# Patient Record
Sex: Male | Born: 1945 | Race: White | Hispanic: No | Marital: Married | Smoking: Current every day smoker
Health system: Southern US, Community
[De-identification: ages and names within clinical notes are randomized; demographics above are authoritative.]

## PROBLEM LIST (undated history)

## (undated) DIAGNOSIS — S2232XA Fracture of one rib, left side, initial encounter for closed fracture: Secondary | ICD-10-CM

## (undated) DIAGNOSIS — M1990 Unspecified osteoarthritis, unspecified site: Secondary | ICD-10-CM

## (undated) DIAGNOSIS — Z87442 Personal history of urinary calculi: Secondary | ICD-10-CM

## (undated) DIAGNOSIS — F329 Major depressive disorder, single episode, unspecified: Secondary | ICD-10-CM

## (undated) DIAGNOSIS — F1090 Alcohol use, unspecified, uncomplicated: Secondary | ICD-10-CM

## (undated) DIAGNOSIS — I712 Thoracic aortic aneurysm, without rupture, unspecified: Secondary | ICD-10-CM

## (undated) DIAGNOSIS — M459 Ankylosing spondylitis of unspecified sites in spine: Secondary | ICD-10-CM

## (undated) DIAGNOSIS — C801 Malignant (primary) neoplasm, unspecified: Secondary | ICD-10-CM

## (undated) DIAGNOSIS — Z7289 Other problems related to lifestyle: Secondary | ICD-10-CM

## (undated) DIAGNOSIS — N400 Enlarged prostate without lower urinary tract symptoms: Secondary | ICD-10-CM

## (undated) DIAGNOSIS — Z789 Other specified health status: Secondary | ICD-10-CM

## (undated) DIAGNOSIS — H269 Unspecified cataract: Secondary | ICD-10-CM

## (undated) DIAGNOSIS — F419 Anxiety disorder, unspecified: Secondary | ICD-10-CM

## (undated) DIAGNOSIS — H9190 Unspecified hearing loss, unspecified ear: Secondary | ICD-10-CM

## (undated) DIAGNOSIS — I10 Essential (primary) hypertension: Secondary | ICD-10-CM

## (undated) DIAGNOSIS — F4310 Post-traumatic stress disorder, unspecified: Secondary | ICD-10-CM

## (undated) DIAGNOSIS — H209 Unspecified iridocyclitis: Secondary | ICD-10-CM

## (undated) DIAGNOSIS — F32A Depression, unspecified: Secondary | ICD-10-CM

## (undated) DIAGNOSIS — J449 Chronic obstructive pulmonary disease, unspecified: Secondary | ICD-10-CM

## (undated) DIAGNOSIS — I2510 Atherosclerotic heart disease of native coronary artery without angina pectoris: Secondary | ICD-10-CM

## (undated) HISTORY — DX: Essential (primary) hypertension: I10

## (undated) HISTORY — PX: BONE CYST EXCISION: SHX376

## (undated) HISTORY — DX: Thoracic aortic aneurysm, without rupture, unspecified: I71.20

## (undated) HISTORY — DX: Alcohol use, unspecified, uncomplicated: F10.90

## (undated) HISTORY — DX: Other specified health status: Z78.9

## (undated) HISTORY — DX: Atherosclerotic heart disease of native coronary artery without angina pectoris: I25.10

## (undated) HISTORY — DX: Thoracic aortic aneurysm, without rupture: I71.2

## (undated) HISTORY — DX: Other problems related to lifestyle: Z72.89

## (undated) SURGERY — CYSTO
Anesthesia: Spinal

---

## 2001-09-04 ENCOUNTER — Ambulatory Visit (HOSPITAL_COMMUNITY): Admission: RE | Admit: 2001-09-04 | Discharge: 2001-09-04 | Payer: Self-pay | Admitting: Unknown Physician Specialty

## 2001-09-04 ENCOUNTER — Encounter: Payer: Self-pay | Admitting: Unknown Physician Specialty

## 2006-05-23 ENCOUNTER — Ambulatory Visit (HOSPITAL_COMMUNITY): Admission: RE | Admit: 2006-05-23 | Discharge: 2006-05-23 | Payer: Self-pay | Admitting: Family Medicine

## 2007-12-08 ENCOUNTER — Emergency Department (HOSPITAL_COMMUNITY): Admission: EM | Admit: 2007-12-08 | Discharge: 2007-12-08 | Payer: Self-pay | Admitting: Emergency Medicine

## 2011-03-28 ENCOUNTER — Ambulatory Visit (HOSPITAL_COMMUNITY)
Admission: RE | Admit: 2011-03-28 | Discharge: 2011-03-28 | Disposition: A | Discharge status: Home / Self Care | Payer: 59 | Source: Ambulatory Visit | Attending: Anesthesiology | Admitting: Anesthesiology

## 2011-03-28 ENCOUNTER — Other Ambulatory Visit (HOSPITAL_COMMUNITY): Payer: Self-pay | Admitting: Anesthesiology

## 2011-03-28 DIAGNOSIS — S2249XA Multiple fractures of ribs, unspecified side, initial encounter for closed fracture: Secondary | ICD-10-CM | POA: Insufficient documentation

## 2011-03-28 DIAGNOSIS — J90 Pleural effusion, not elsewhere classified: Secondary | ICD-10-CM | POA: Insufficient documentation

## 2011-03-28 DIAGNOSIS — X58XXXA Exposure to other specified factors, initial encounter: Secondary | ICD-10-CM | POA: Insufficient documentation

## 2011-03-28 DIAGNOSIS — R079 Chest pain, unspecified: Secondary | ICD-10-CM | POA: Insufficient documentation

## 2011-03-28 DIAGNOSIS — R52 Pain, unspecified: Secondary | ICD-10-CM

## 2011-03-28 DIAGNOSIS — R0602 Shortness of breath: Secondary | ICD-10-CM | POA: Insufficient documentation

## 2011-04-05 ENCOUNTER — Other Ambulatory Visit (HOSPITAL_COMMUNITY): Payer: Self-pay | Admitting: Family Medicine

## 2011-04-05 DIAGNOSIS — S2239XA Fracture of one rib, unspecified side, initial encounter for closed fracture: Secondary | ICD-10-CM

## 2011-04-06 ENCOUNTER — Ambulatory Visit (HOSPITAL_COMMUNITY)
Admission: RE | Admit: 2011-04-06 | Discharge: 2011-04-06 | Disposition: A | Discharge status: Home / Self Care | Payer: 59 | Source: Ambulatory Visit | Attending: Family Medicine | Admitting: Family Medicine

## 2011-04-06 DIAGNOSIS — X58XXXA Exposure to other specified factors, initial encounter: Secondary | ICD-10-CM | POA: Insufficient documentation

## 2011-04-06 DIAGNOSIS — R079 Chest pain, unspecified: Secondary | ICD-10-CM | POA: Insufficient documentation

## 2011-04-06 DIAGNOSIS — S2249XA Multiple fractures of ribs, unspecified side, initial encounter for closed fracture: Secondary | ICD-10-CM | POA: Insufficient documentation

## 2011-04-06 DIAGNOSIS — S2239XA Fracture of one rib, unspecified side, initial encounter for closed fracture: Secondary | ICD-10-CM

## 2011-06-09 ENCOUNTER — Encounter (HOSPITAL_COMMUNITY): Payer: Self-pay | Admitting: Pharmacy Technician

## 2011-06-10 ENCOUNTER — Encounter (HOSPITAL_COMMUNITY)
Admission: RE | Admit: 2011-06-10 | Discharge: 2011-06-10 | Disposition: A | Discharge status: Home / Self Care | Payer: 59 | Source: Ambulatory Visit | Attending: Urology | Admitting: Urology

## 2011-06-10 ENCOUNTER — Other Ambulatory Visit: Payer: Self-pay

## 2011-06-10 ENCOUNTER — Encounter (HOSPITAL_COMMUNITY): Payer: Self-pay

## 2011-06-10 HISTORY — DX: Benign prostatic hyperplasia without lower urinary tract symptoms: N40.0

## 2011-06-10 HISTORY — DX: Major depressive disorder, single episode, unspecified: F32.9

## 2011-06-10 HISTORY — DX: Depression, unspecified: F32.A

## 2011-06-10 HISTORY — DX: Post-traumatic stress disorder, unspecified: F43.10

## 2011-06-10 HISTORY — DX: Ankylosing spondylitis of unspecified sites in spine: M45.9

## 2011-06-10 HISTORY — DX: Unspecified cataract: H26.9

## 2011-06-10 HISTORY — DX: Fracture of one rib, left side, initial encounter for closed fracture: S22.32XA

## 2011-06-10 LAB — BASIC METABOLIC PANEL
BUN: 9 mg/dL (ref 6–23)
Chloride: 98 mEq/L (ref 96–112)
Creatinine, Ser: 0.7 mg/dL (ref 0.50–1.35)
GFR calc Af Amer: >90 mL/min (ref >90)
Glucose, Bld: 121 mg/dL — ABNORMAL HIGH (ref 70–99)
Potassium: 4.1 mEq/L (ref 3.5–5.1)

## 2011-06-10 LAB — SURGICAL PCR SCREEN: MRSA, PCR: NEGATIVE

## 2011-06-10 NOTE — Patient Instructions (Signed)
[name]  06/10/2011   Your procedure is scheduled on:  06/16/11  Report to [facility] at 07:00 AM.  Call this number if you have problems the morning of surgery: [phone]   Remember:   Do not eat food:After Midnight.  May have clear liquids:until Midnight .  Clear liquids include soda, tea, black coffee, apple or grape juice, broth.  Take these medicines the morning of surgery with A SIP OF WATER: Lisinopril-HCTZ, Metoprolol and Flomax. Take your Hydrocodone only if needed.   Do not wear jewelry, make-up or nail polish.  Do not wear lotions, powders, or perfumes. You may wear deodorant.  Do not shave 48 hours prior to surgery.  Do not bring valuables to the hospital.  Contacts, dentures or bridgework may not be worn into surgery.  Leave suitcase in the car. After surgery it may be brought to your room.  For patients admitted to the hospital, checkout time is 11:00 AM the day of discharge.   Patients discharged the day of surgery will not be allowed to drive home.  Name and phone number of your driver:   Special Instructions: CHG Shower Use Special Wash: 1/2 bottle night before surgery and 1/2 bottle morning of surgery.   Please read over the following fact sheets that you were given: Pain Booklet, MRSA Information, Surgical Site Infection Prevention, Anesthesia Post-op Instructions and Care and Recovery After Surgery    Transurethral Resection of the Prostate Transurethral Resection of the Prostate (TURP) is often treatment for non-cancerous (benign) prostatic hyperplasia (BPH) or prostate cancer. BPH commonly begins in middle aged men and can cause symptoms at any age thereafter. The complications that stem from these problems, such as recurrent infection and bladder control and emptying problems, are often helped by this procedure. Both conditions usually cause the prostate to increase in size. TURP is a major surgery that removes part of the prostate gland. The goal is to  remove enough prostate to allow for unobstructed flow of urine. TREATMENT This surgery (TURP procedure) is done using an instrument like a narrow telescope to look into your bladder. This is then used to remove enlarged pieces of your prostate, one piece at a time. This removes the blockage and makes it easier for you to urinate. This is called a transurethral resection of the prostate. A lesser procedure is sometimes done. In this procedure, small cuts are made in the prostate. This lessens the prostates pressure on the urethra. This is called a transurethral incision (cut by the surgeon) of the prostate (TUIP). You will probably be comfortable soon after the operation, but it will take 10 to 12 weeks, or longer, for your prostate to heal after you leave the hospital.  LET YOUR CAREGIVER KNOW ABOUT:  Allergies.   Medications taken including herbs, eye drops, over the counter medications, and creams.   Use of steroids (by mouth or creams).   Previous problems with anesthetics or Novocaine.   History of blood clots (thrombophlebitis).   History of bleeding or blood problems.   Previous surgery.   Previous prostate infections.   Other health problems.  RISKS AND COMPLICATIONS  Rare injury to the bowel (intestine).   Rare injury to the bladder or adjacent blood vessels.   Intestinal/bowel obstruction.   Scarring called "stricture" that cause later problems with the flow of urine.   Bleeding and the need for blood transfusion (more common in those with prostate cancer and those who have previously received radiation therapy).  Inability to control your urine (incontinence). This is more common in those with prostate cancer and those who have received radiation therapy.   Injury to one of the ureters (tubes that drain the kidneys into the bladder) and/or urethra (the tube that drains the bladder).   Injury to the capsule that holds the prostate. This can lead to leakage of fluid  and urine into the belly (abdomen).   The operation can possibly lead to impotence. This is the inability to get an erection. Treatments are available for these types of problems.   Rare over absorption of the fluids used during the operation. This can then cause low blood levels of sodium, brain swelling, strain on the heart, and fluid accumulation in the lungs. This is more common in long procedures.   Infection.   Blood clots in the legs.   As with any major surgery, there is always the rare chance of a complicating stroke, heart attack, or other complications that will be discussed with you by the surgeon and anesthesiologist.  BEFORE THE PROCEDURE   You may be asked to temporarily adjust your diet. If so, your caregiver will give you specific recommendations.   If you are on blood thinners, stop taking them before the operation, or as your caregiver advises.   You should have nothing to eat or drink after midnight prior to your surgery or as suggested by your caregiver. You may have a sip of water to take medications not stopped for the procedure  PROCEDURE  This operation is performed after you have been given a medication to help you sleep (anesthetic), or with a spinal block. The spinal block keeps you awake but numb from the waist down.  No cut or incision is needed. During the operation, your surgeon passes a viewing and cutting instrument (resectoscope) through the penis into the prostate gland. The instrument contains an electric cutting edge. From inside the prostate, this cutter is used to remove part of the prostate.  HOME CARE INSTRUCTIONS  For your own protection, observe the following precautions for 10 days after your operation.  You may go home with a catheter. Take care of it as directed. You will receive instruction on catheter care.   After catheter removal, empty the bladder whenever you feel a definite desire. Do not try to hold the urine for long periods of time.     For 10 days, avoid all lifting, straining, running, strenuous work, walks longer than a couple blocks, riding in a car for extended periods, and sexual relations.   Take 2 tablespoons of heavy mineral oil or Metamucil night and morning for 3 or 4 days. After that, gradually reduce the dose to one or two teaspoons twice daily. Stop it after the stools have been normal for a week. If you become constipated, do not strain to move your bowels. You may use an enema. Notify your caregiver about problems.   Even after complete healing, you may continue to urinate once or twice during the night.   In addition to your usual medications, you may be given an antibiotic to take for 10-14 days. Notify your caregiver if you have any side effects or problems with the medication.   Avoid alcohol and caffeinated drinks for 2 weeks, as they are irritating to the bladder. Decaffeinated drinks are fine.   Eat a regular diet, avoiding spicy foods for 2 weeks.   You may continue non-strenuous activities. It is always important to keep active after an  operation. This lessens the chance of developing blood clots.   You may see some recurrence of blood in the urine after discharge from the hospital. Even a small amount of blood colors the urine very red. If this occurs, force fluids again as you did in the hospital. This is generally not a concern.  SEEK MEDICAL CARE IF:   You have chills or night sweats.   You are leaking around your catheter or have problems with your catheter.   You develop side effects that you think are coming from your medications.  SEEK IMMEDIATE MEDICAL CARE IF:   You are suddenly unable to urinate. This is an emergency.   You develop shortness of breath or chest pains.   Bleeding persists or clots develop.   You have a fever.   You develop pain in your back or over your lower belly (abdomen).   You develop pain or swelling in your legs.   You develop swelling in your abdomen  or have a sudden weight gain.   The problems get worse rather than better.  Document Released: 04/04/2005 Document Revised: 12/15/2010 Document Reviewed: 10/21/2008 [facility] Patient Information 2012 [place], [place].   PATIENT INSTRUCTIONS POST-ANESTHESIA  IMMEDIATELY FOLLOWING SURGERY:  Do not drive or operate machinery for the first twenty four hours after surgery.  Do not make any important decisions for twenty four hours after surgery or while taking narcotic pain medications or sedatives.  If you develop intractable nausea and vomiting or a severe headache please notify your doctor immediately.  FOLLOW-UP:  Please make an appointment with your surgeon as instructed. You do not need to follow up with anesthesia unless specifically instructed to do so.  WOUND CARE INSTRUCTIONS (if applicable):  Keep a dry clean dressing on the anesthesia/puncture wound site if there is drainage.  Once the wound has quit draining you may leave it open to air.  Generally you should leave the bandage intact for twenty four hours unless there is drainage.  If the epidural site drains for more than 36-48 hours please call the anesthesia department.  QUESTIONS?:  Please feel free to call your physician or the hospital operator if you have any questions, and they will be happy to assist you.     [facility] Anesthesia Department [address] [place] [phone]

## 2011-06-16 ENCOUNTER — Encounter (HOSPITAL_COMMUNITY)
Admission: RE | Disposition: A | Discharge status: Home / Self Care | Payer: Self-pay | Source: Ambulatory Visit | Attending: Urology

## 2011-06-16 ENCOUNTER — Ambulatory Visit (HOSPITAL_COMMUNITY): Payer: 59 | Admitting: Anesthesiology

## 2011-06-16 ENCOUNTER — Encounter (HOSPITAL_COMMUNITY): Payer: Self-pay | Admitting: *Deleted

## 2011-06-16 ENCOUNTER — Encounter (HOSPITAL_COMMUNITY): Payer: Self-pay | Admitting: Anesthesiology

## 2011-06-16 ENCOUNTER — Encounter (HOSPITAL_COMMUNITY): Payer: Self-pay

## 2011-06-16 ENCOUNTER — Ambulatory Visit (HOSPITAL_COMMUNITY)
Admission: RE | Admit: 2011-06-16 | Discharge: 2011-06-17 | Disposition: A | Discharge status: Home / Self Care | Payer: 59 | Source: Ambulatory Visit | Attending: Urology | Admitting: Urology

## 2011-06-16 DIAGNOSIS — N400 Enlarged prostate without lower urinary tract symptoms: Secondary | ICD-10-CM

## 2011-06-16 DIAGNOSIS — N320 Bladder-neck obstruction: Secondary | ICD-10-CM | POA: Insufficient documentation

## 2011-06-16 DIAGNOSIS — N401 Enlarged prostate with lower urinary tract symptoms: Secondary | ICD-10-CM | POA: Insufficient documentation

## 2011-06-16 DIAGNOSIS — N138 Other obstructive and reflux uropathy: Secondary | ICD-10-CM | POA: Insufficient documentation

## 2011-06-16 DIAGNOSIS — Z01810 Encounter for preprocedural cardiovascular examination: Secondary | ICD-10-CM | POA: Insufficient documentation

## 2011-06-16 DIAGNOSIS — Z01812 Encounter for preprocedural laboratory examination: Secondary | ICD-10-CM | POA: Insufficient documentation

## 2011-06-16 DIAGNOSIS — F17200 Nicotine dependence, unspecified, uncomplicated: Secondary | ICD-10-CM | POA: Insufficient documentation

## 2011-06-16 HISTORY — PX: TRANSURETHRAL RESECTION OF PROSTATE: SHX73

## 2011-06-16 LAB — GLUCOSE, CAPILLARY: Glucose-Capillary: 81 mg/dL (ref 70–99)

## 2011-06-16 SURGERY — TURP (TRANSURETHRAL RESECTION OF PROSTATE)
Anesthesia: Spinal | Site: Prostate | Wound class: Clean Contaminated

## 2011-06-16 MED ORDER — GLYCINE 1.5 % IR SOLN
Status: DC | PRN
  Administered 2011-06-16: 6000 mL

## 2011-06-16 MED ORDER — FENTANYL CITRATE 0.05 MG/ML IJ SOLN
INTRAMUSCULAR | Status: DC | PRN
  Administered 2011-06-16: 20 ug via INTRATHECAL

## 2011-06-16 MED ORDER — ZOLPIDEM TARTRATE 5 MG PO TABS: 5.0000 mg | ORAL_TABLET | Freq: Every evening | ORAL | Status: DC | PRN

## 2011-06-16 MED ORDER — EPHEDRINE SULFATE 50 MG/ML IJ SOLN
INTRAMUSCULAR | Status: DC | PRN
  Administered 2011-06-16 (×3): 10 mg via INTRAVENOUS

## 2011-06-16 MED ORDER — LIDOCAINE HCL (PF) 1 % IJ SOLN
INTRAMUSCULAR | Status: AC
  Filled 2011-06-16: qty 5

## 2011-06-16 MED ORDER — BELLADONNA ALKALOIDS-OPIUM 16.2-60 MG RE SUPP
1.0000 | Freq: Four times a day (QID) | RECTAL | Status: DC | PRN
  Administered 2011-06-16: 1 via RECTAL
  Filled 2011-06-16: qty 1

## 2011-06-16 MED ORDER — LACTATED RINGERS IV SOLN
INTRAVENOUS | Status: DC
  Administered 2011-06-16: 1000 mL via INTRAVENOUS

## 2011-06-16 MED ORDER — PROPOFOL 10 MG/ML IV EMUL
INTRAVENOUS | Status: DC | PRN
  Administered 2011-06-16: 75 ug/kg/min via INTRAVENOUS

## 2011-06-16 MED ORDER — OMEGA-3-ACID ETHYL ESTERS 1 G PO CAPS
1.0000 g | ORAL_CAPSULE | Freq: Every day | ORAL | Status: DC
  Administered 2011-06-16 – 2011-06-17 (×2): 1 g via ORAL
  Filled 2011-06-16 (×2): qty 1

## 2011-06-16 MED ORDER — BUPIVACAINE IN DEXTROSE 0.75-8.25 % IT SOLN
INTRATHECAL | Status: AC
  Filled 2011-06-16: qty 2

## 2011-06-16 MED ORDER — EPHEDRINE SULFATE 50 MG/ML IJ SOLN
INTRAMUSCULAR | Status: AC
  Filled 2011-06-16: qty 1

## 2011-06-16 MED ORDER — PHENOL 1.4 % MT LIQD: 1.0000 | OROMUCOSAL | Status: DC | PRN

## 2011-06-16 MED ORDER — MIDAZOLAM HCL 2 MG/2ML IJ SOLN
INTRAMUSCULAR | Status: AC
  Filled 2011-06-16: qty 2

## 2011-06-16 MED ORDER — PROPOFOL 10 MG/ML IV EMUL
INTRAVENOUS | Status: AC
  Filled 2011-06-16: qty 20

## 2011-06-16 MED ORDER — ONDANSETRON HCL 4 MG/2ML IJ SOLN: 4.0000 mg | Freq: Once | INTRAMUSCULAR | Status: DC | PRN

## 2011-06-16 MED ORDER — MORPHINE SULFATE 2 MG/ML IJ SOLN
2.0000 mg | INTRAMUSCULAR | Status: DC | PRN
  Administered 2011-06-16 (×2): 2 mg via INTRAVENOUS
  Filled 2011-06-16 (×2): qty 1

## 2011-06-16 MED ORDER — ONDANSETRON HCL 4 MG/2ML IJ SOLN: 4.0000 mg | INTRAMUSCULAR | Status: DC | PRN

## 2011-06-16 MED ORDER — METOPROLOL SUCCINATE ER 50 MG PO TB24
50.0000 mg | ORAL_TABLET | Freq: Every day | ORAL | Status: DC
Start: 2011-06-16 — End: 2011-06-17
  Administered 2011-06-17: 50 mg via ORAL
  Filled 2011-06-16: qty 1

## 2011-06-16 MED ORDER — FLAX SEED OIL 1000 MG PO CAPS: 2000.0000 mg | ORAL_CAPSULE | Freq: Every day | ORAL | Status: DC

## 2011-06-16 MED ORDER — FENTANYL CITRATE 0.05 MG/ML IJ SOLN
INTRAMUSCULAR | Status: AC
  Filled 2011-06-16: qty 4

## 2011-06-16 MED ORDER — LISINOPRIL-HYDROCHLOROTHIAZIDE 20-12.5 MG PO TABS: 2.0000 | ORAL_TABLET | Freq: Every day | ORAL | Status: DC

## 2011-06-16 MED ORDER — CIPROFLOXACIN IN D5W 200 MG/100ML IV SOLN
INTRAVENOUS | Status: AC
  Filled 2011-06-16: qty 100

## 2011-06-16 MED ORDER — PHYTOSTEROL ESTERS 500 MG PO CAPS: 1.0000 | ORAL_CAPSULE | Freq: Every day | ORAL | Status: DC

## 2011-06-16 MED ORDER — SODIUM CHLORIDE 0.9 % IR SOLN
3000.0000 mL | Status: DC
  Administered 2011-06-16 – 2011-06-17 (×8): 3000 mL

## 2011-06-16 MED ORDER — CIPROFLOXACIN IN D5W 400 MG/200ML IV SOLN
INTRAVENOUS | Status: DC | PRN
  Administered 2011-06-16: 200 mg via INTRAVENOUS

## 2011-06-16 MED ORDER — DEXTROSE-NACL 5-0.45 % IV SOLN
INTRAVENOUS | Status: DC
  Administered 2011-06-16: 100 mL/h via INTRAVENOUS
  Administered 2011-06-16: 13:00:00 via INTRAVENOUS

## 2011-06-16 MED ORDER — BISACODYL 10 MG RE SUPP: 10.0000 mg | Freq: Every day | RECTAL | Status: DC | PRN

## 2011-06-16 MED ORDER — LISINOPRIL 10 MG PO TABS
20.0000 mg | ORAL_TABLET | Freq: Every day | ORAL | Status: DC
  Administered 2011-06-17: 20 mg via ORAL
  Filled 2011-06-16: qty 2

## 2011-06-16 MED ORDER — ACETAMINOPHEN 325 MG PO TABS: 650.0000 mg | ORAL_TABLET | ORAL | Status: DC | PRN

## 2011-06-16 MED ORDER — GLYCINE 1.5 % IR SOLN
Status: DC | PRN
  Administered 2011-06-16: 21000 mL

## 2011-06-16 MED ORDER — MIDAZOLAM HCL 2 MG/2ML IJ SOLN
1.0000 mg | INTRAMUSCULAR | Status: DC | PRN
  Administered 2011-06-16: 2 mg via INTRAVENOUS

## 2011-06-16 MED ORDER — ADULT MULTIVITAMIN W/MINERALS CH
1.0000 | ORAL_TABLET | Freq: Every day | ORAL | Status: DC
  Administered 2011-06-16 – 2011-06-17 (×2): 1 via ORAL
  Filled 2011-06-16 (×2): qty 1

## 2011-06-16 MED ORDER — FENTANYL CITRATE 0.05 MG/ML IJ SOLN
INTRAMUSCULAR | Status: DC | PRN
  Administered 2011-06-16 (×2): 25 ug via INTRAVENOUS
  Administered 2011-06-16: 30 ug via INTRAVENOUS

## 2011-06-16 MED ORDER — BACITRACIN-NEOMYCIN-POLYMYXIN 400-5-5000 EX OINT: 1.0000 "application " | TOPICAL_OINTMENT | Freq: Three times a day (TID) | CUTANEOUS | Status: DC | PRN

## 2011-06-16 MED ORDER — FENTANYL CITRATE 0.05 MG/ML IJ SOLN: 25.0000 ug | INTRAMUSCULAR | Status: DC | PRN

## 2011-06-16 MED ORDER — MENTHOL 3 MG MT LOZG: 1.0000 | LOZENGE | OROMUCOSAL | Status: DC | PRN

## 2011-06-16 MED ORDER — HYDROCHLOROTHIAZIDE 12.5 MG PO CAPS
12.5000 mg | ORAL_CAPSULE | Freq: Every day | ORAL | Status: DC
  Administered 2011-06-17: 12.5 mg via ORAL
  Filled 2011-06-16: qty 1

## 2011-06-16 MED ORDER — LACTATED RINGERS IV SOLN
INTRAVENOUS | Status: DC | PRN
  Administered 2011-06-16 (×2): via INTRAVENOUS

## 2011-06-16 MED ORDER — STERILE WATER FOR IRRIGATION IR SOLN
Status: DC | PRN
  Administered 2011-06-16: 1000 mL

## 2011-06-16 SURGICAL SUPPLY — 35 items
BAG DECANTER FOR FLEXI CONT (MISCELLANEOUS) ×2 IMPLANT
BAG DRAIN URO TABLE W/ADPT NS (DRAPE) ×2 IMPLANT
BAG DRN 8 ADPR NS SKTRN CSTL (DRAPE) ×1
BAG DRN URN TUBE DRIP CHMBR (OSTOMY) ×1
BAG URINE DRAIN TURP 4L (OSTOMY) ×2 IMPLANT
CABLE HI FREQUENCY MONOPOLAR (ELECTROSURGICAL) ×2 IMPLANT
CATH FOLEY 3WAY 30CC 22F (CATHETERS) ×2 IMPLANT
CLOTH BEACON ORANGE TIMEOUT ST (SAFETY) ×2 IMPLANT
CONNECTOR 5 IN 1 STRAIGHT STRL (MISCELLANEOUS) ×2 IMPLANT
DRAPE STERI URO 23X35 APER SZ5 (DRAPE) ×2 IMPLANT
ELECT CUT LOOP C-MAX 27FR .012 (CUTTING LOOP) ×2
ELECT REM PT RETURN 9FT ADLT (ELECTROSURGICAL) ×2
ELECTRODE CUT LP CMX 27FR .012 (CUTTING LOOP) IMPLANT
ELECTRODE REM PT RTRN 9FT ADLT (ELECTROSURGICAL) ×1 IMPLANT
FLOOR PAD 36X40 (MISCELLANEOUS)
FORMALIN 10 PREFIL 480ML (MISCELLANEOUS) ×1 IMPLANT
GLOVE BIO SURGEON STRL SZ7 (GLOVE) ×2 IMPLANT
GLOVE ECLIPSE 6.5 STRL STRAW (GLOVE) ×1 IMPLANT
GLOVE EXAM NITRILE MD LF STRL (GLOVE) ×1 IMPLANT
GLOVE INDICATOR 6.5 STRL GRN (GLOVE) ×1 IMPLANT
GLOVE INDICATOR 7.0 STRL GRN (GLOVE) ×1 IMPLANT
GLYCINE 1.5% IRRIG UROMATIC (IV SOLUTION) ×13 IMPLANT
GOWN STRL REIN XL XLG (GOWN DISPOSABLE) ×2 IMPLANT
IV NS IRRIG 3000ML ARTHROMATIC (IV SOLUTION) ×2 IMPLANT
KIT ROOM TURNOVER AP CYSTO (KITS) ×2 IMPLANT
MANIFOLD NEPTUNE II (INSTRUMENTS) ×2 IMPLANT
PACK CYSTO (CUSTOM PROCEDURE TRAY) ×2 IMPLANT
PAD ARMBOARD 7.5X6 YLW CONV (MISCELLANEOUS) ×2 IMPLANT
PAD FLOOR 36X40 (MISCELLANEOUS) ×1 IMPLANT
SET IRRIGATING DISP (SET/KITS/TRAYS/PACK) ×2 IMPLANT
SYR 30ML LL (SYRINGE) ×2 IMPLANT
TOWEL OR 17X26 4PK STRL BLUE (TOWEL DISPOSABLE) ×2 IMPLANT
WATER STERILE IRR 1000ML POUR (IV SOLUTION) ×2 IMPLANT
XPEEDA 550 SIDEFIRING FIBER (MISCELLANEOUS) IMPLANT
YANKAUER SUCT BULB TIP 10FT TU (MISCELLANEOUS) ×2 IMPLANT

## 2011-06-16 NOTE — Anesthesia Preprocedure Evaluation (Signed)
Anesthesia Evaluation  Patient identified by MRN, date of birth, ID band Patient awake    Reviewed: Allergy & Precautions, H&P , NPO status , Patient's Chart, lab work & pertinent test results, reviewed documented beta blocker date and time   History of Anesthesia Complications Negative for: history of anesthetic complications  Airway Mallampati: I      Dental  (+) Teeth Intact and Implants   Pulmonary COPDCurrent Smoker,  + rhonchi        Cardiovascular hypertension, Pt. on medications Regular Normal    Neuro/Psych PSYCHIATRIC DISORDERS (PTSD) Depression    GI/Hepatic   Endo/Other    Renal/GU      Musculoskeletal   Abdominal   Peds  Hematology   Anesthesia Other Findings   Reproductive/Obstetrics                           Anesthesia Physical Anesthesia Plan  ASA: III  Anesthesia Plan: Spinal   Post-op Pain Management:    Induction:   Airway Management Planned: Nasal Cannula  Additional Equipment:   Intra-op Plan:   Post-operative Plan:   Informed Consent: I have reviewed the patients History and Physical, chart, labs and discussed the procedure including the risks, benefits and alternatives for the proposed anesthesia with the patient or authorized representative who has indicated his/her understanding and acceptance.     Plan Discussed with:   Anesthesia Plan Comments:         Anesthesia Quick Evaluation

## 2011-06-16 NOTE — Preoperative (Signed)
Beta Blockers   Reason not to administer Beta Blockers:Not Applicable 

## 2011-06-16 NOTE — Plan of Care (Signed)
Problem: Phase I Progression Outcomes Goal: Continue bladder and/or hand irrigation Outcome: Completed/Met Date Met:  06/16/11 Red to pink without clots

## 2011-06-16 NOTE — Anesthesia Procedure Notes (Addendum)
Spinal  Patient location during procedure: OR Start time: 06/16/2011 8:46 AM Staffing CRNA/Resident: ANDRAZA, Yomira Flitton Preanesthetic Checklist Completed: patient identified, site marked, surgical consent, pre-op evaluation, timeout performed, IV checked, risks and benefits discussed and monitors and equipment checked Spinal Block Patient position: right lateral decubitus Prep: Betadine Patient monitoring: heart rate, cardiac monitor, continuous pulse ox and blood pressure Approach: right paramedian Location: L3-4 Injection technique: single-shot Needle Needle type: Spinocan  Needle gauge: 22 G Needle length: 9 cm Assessment Sensory level: T8 Additional Notes ATTEMPTS:1 TRAY ZO:[id number] TRAY EXPIRATION DATE:02/2012  Date/Time: 06/16/2011 9:31 AM Performed by: Carolyne Littles, Cari Burgo Pre-anesthesia Checklist: Patient identified, Patient being monitored, Emergency Drugs available, Timeout performed and Suction available Oxygen Delivery Method: Non-rebreather mask   Sensorcaine.75% 2cc with epi .1 and Fentanyl 20 mcg injected intrathecally at 0846; patient tolerated well.

## 2011-06-16 NOTE — Progress Notes (Signed)
No change in H&P on reexamination. 

## 2011-06-16 NOTE — Op Note (Signed)
NAMEMAKEL, MCMANN NO.:  [id number]  MEDICAL RECORD NO.:  [id number]  LOCATION:  A321                          FACILITY:  [facility]  PHYSICIAN:  Ky Barban, M.D.DATE OF BIRTH:  01/17/1946  DATE OF PROCEDURE:  06/16/2011 DATE OF DISCHARGE:                              OPERATIVE REPORT   PREOPERATIVE DIAGNOSES:  Benign prostatic hypertrophy with bladder neck obstruction.  POSTOPERATIVE DIAGNOSES:  Benign prostatic hypertrophy with bladder neck obstruction.  PROCEDURE:  TUR prostate.  ANESTHESIA:  Spinal.  PROCEDURE IN DETAIL:  The patient under spinal anesthesia, in lithotomy position, usual prep and drape, a #28 Iglesias resectoscope was introduced into the bladder.  The resectoscope was pulled back in mid prostatic urethra.  Median lobe was resected up to the level of the mid prostatic urethra.  Then the bladder neck was circumferentially dissected down to the circular fibers.  Resectoscope was pulled back at the level of the verumontanum rotated to 11 o'clock position.  Resection of the right lobe was done at that point, and once the groove was created then, I resected the right lobe between 11 and 7 o'clock position.  Similarly, the left lobe was resected between 1 and 5 o'clock position.  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  There is a posterior midline tissue, which was resected at this point carefully not to injure the sphincter of the verumontanum.  Then, the anterior midline tissue which was small was resected at the end.  Prostatic urethra is wide open. Chips were evacuated.  Bleeders were coagulated.  Resectoscope was removed and 22 three-way Foley catheter left in for drainage.  CBI is clear.  The patient left the operating room in satisfactory condition.     Ky Barban, M.D.     MIJ/MEDQ  D:  06/16/2011  T:  06/16/2011  Job:  [id number]

## 2011-06-16 NOTE — Transfer of Care (Signed)
Immediate Anesthesia Transfer of Care Note  Patient: Samuel Long  Procedure(s) Performed: Procedure(s) (LRB): TRANSURETHRAL RESECTION OF THE PROSTATE (TURP) (N/A)  Patient Location: PACU  Anesthesia Type: Spinal  Level of Consciousness: awake, alert  and oriented  Airway & Oxygen Therapy: Patient Spontanous Breathing and Patient connected to nasal cannula oxygen  Post-op Assessment: Report given to PACU RN and Post -op Vital signs reviewed and stable  Post vital signs: Reviewed and stable  Complications: No apparent anesthesia complications

## 2011-06-16 NOTE — Brief Op Note (Signed)
06/16/2011  9:54 AM  PATIENT:  Samuel Long  66 y.o. male  PRE-OPERATIVE DIAGNOSIS:  benign prostatic hypertrophy  POST-OPERATIVE DIAGNOSIS:  benign prostatic hypertrophy  PROCEDURE:  Procedure(s) (LRB): TRANSURETHRAL RESECTION OF THE PROSTATE (TURP) (N/A)  SURGEON:  Surgeon(s) and Role:    * Ky Barban, MD - Primary  PHYSICIAN ASSISTANT:   ASSISTANTS: none   ANESTHESIA:   spinal  EBL:  Total I/O In: 1200 [I.V.:1200] Out: -   BLOOD ADMINISTERED:none  DRAINS: Urinary Catheter (Foley)   LOCAL MEDICATIONS USED:  NONE  SPECIMEN:  Source of Specimen:  prostate chips  DISPOSITION OF SPECIMEN:  PATHOLOGY  COUNTS:  YES  TOURNIQUET:  * No tourniquets in log *  DICTATION: .Other Dictation: Dictation Number dictation [id number]  PLAN OF CARE: Admit for overnight observation  PATIENT DISPOSITION:  PACU - hemodynamically stable.   Delay start of Pharmacological VTE agent (>24hrs) due to surgical blood loss or risk of bleeding:

## 2011-06-16 NOTE — Progress Notes (Signed)
PHARMACIST - PHYSICIAN ORDER COMMUNICATION  CONCERNING: P&T Medication Policy on Herbal Medications  DESCRIPTION:  This patient's orders for:  Flax Seed Oil and Phytosterol Esters  have been noted.  This product(s) is classified as an "herbal" or natural product. Due to a lack of definitive safety studies or FDA approval, nonstandard manufacturing practices, plus the potential risk of unknown drug-drug interactions while on inpatient medications, the Pharmacy and Therapeutics Committee does not permit the use of "herbal" or natural products of this type within [facility].   ACTION TAKEN: The pharmacy department is unable to verify this order at this time and your patient has been informed of this safety policy. Please reevaluate patient's clinical condition at discharge and address if the herbal or natural product(s) should be resumed at that time.   Mady Gemma, [facility] 06/16/2011 12:35 PM

## 2011-06-16 NOTE — Anesthesia Postprocedure Evaluation (Addendum)
  Anesthesia Post-op Note  Patient: Samuel Long  Procedure(s) Performed: Procedure(s) (LRB): TRANSURETHRAL RESECTION OF THE PROSTATE (TURP) (N/A)  Patient Location: PACU  Anesthesia Type: Spinal  Level of Consciousness: awake, alert , oriented and patient cooperative  Airway and Oxygen Therapy: Patient Spontanous Breathing and Patient connected to nasal cannula oxygen  Post-op Pain: none  Post-op Assessment: Post-op Vital signs reviewed, Patient's Cardiovascular Status Stable, Respiratory Function Stable, Patent Airway and No signs of Nausea or vomiting  Post-op Vital Signs: Reviewed and stable  Complications: No apparent anesthesia complications  06/17/11  Patient doing very well and pleased with  SAB.  No apparent anesthesia complications.

## 2011-06-16 NOTE — H&P (Signed)
NAMEMICHELE, Long NO.:  [id number]  MEDICAL RECORD NO.:  [id number]  LOCATION:                                 FACILITY:  PHYSICIAN:  Ky Barban, M.D.DATE OF BIRTH:  1945/07/07  DATE OF ADMISSION: DATE OF DISCHARGE:  LH                             HISTORY & PHYSICAL   CHIEF COMPLAINT:  Symptoms of prostatism and elevated PSA.  HISTORY:  This 66 year old gentleman is being followed by me since 2006, after he was referred to me by Dr. Christain Sacramento because of his elevated PSA. He has been running little bit high PSA ever since and he had a prostate biopsy done few years ago, which was negative.  He still has elevated PSA and I have put him on Flomax because his bladder does not empty. Recently, I have worked him up again and he was found to have large prostate with bladder neck obstruction.  His PSA is 7.3.  His 3 PSA is 21.2%.  So, I have advised him to undergo TUR of prostate.  He has been taking Flomax.  It is not helping him to empty his bladder.  Flow study shows that he has bladder in very slow flow rate and large residual urine of 200 mL.  So, I am going to bring him back as I am bringing him in the hospital as outpatient to undergo TUR of prostate.  Procedure limitation and complications were discussed.  PAST HISTORY:  History of passing a kidney stone several years ago.  Has hypertension for which, he takes medicine.  No diabetes.  Never had any other surgery.  FAMILY HISTORY:  One brother had prostate cancer.  PERSONAL HISTORY:  He smokes 1 pack a day.  Drinks some beer every week.  REVIEW OF SYSTEMS:  Unremarkable.  PHYSICAL EXAMINATION:  VITAL SIGNS:  Blood pressure 130/80, temperature is normal. CENTRAL NERVOUS SYSTEM:  No gross neurological deficit. HEAD, NECK, EYE, ENT:  Negative. CHEST:  Symmetrical. HEART:  Regular sinus rhythm.  No murmur. ABDOMEN:  Soft, flat.  Liver, spleen, are kidneys not palpable.  No  CVA tenderness. GENITOURINARY:  External genitalia is uncircumcised, meatus adequate. Testicles are normal. RECTAL:  Normal sphincter tone.  No rectal mass.  Prostate 30 g, smooth and firm, nontender.  IMPRESSION:  Benign prostatic hypertrophy with bladder neck obstruction.  PLAN:  TUR of prostate under anesthesia as outpatient.     Ky Barban, M.D.     MIJ/MEDQ  D:  06/15/2011  T:  06/15/2011  Job:  [id number]

## 2011-06-17 LAB — CBC
HCT: 41.3 % (ref 39.0–52.0)
MCV: 94.7 fL (ref 78.0–100.0)
Platelets: 296 10*3/uL (ref 150–400)
RBC: 4.36 MIL/uL (ref 4.22–5.81)
WBC: 8.4 10*3/uL (ref 4.0–10.5)

## 2011-06-17 LAB — BASIC METABOLIC PANEL
BUN: 6 mg/dL (ref 6–23)
CO2: 29 mEq/L (ref 19–32)
Chloride: 104 mEq/L (ref 96–112)
Creatinine, Ser: 0.59 mg/dL (ref 0.50–1.35)

## 2011-06-17 MED ORDER — TAMSULOSIN HCL 0.4 MG PO CAPS: 0.4000 mg | ORAL_CAPSULE | Freq: Every day | ORAL | Status: DC

## 2011-06-17 MED ORDER — HYDROCODONE-IBUPROFEN 7.5-200 MG PO TABS: 1.0000 | ORAL_TABLET | Freq: Four times a day (QID) | ORAL | Status: DC | PRN

## 2011-06-17 MED ORDER — FENOFIBRATE 145 MG PO TABS: 145.0000 mg | ORAL_TABLET | Freq: Every day | ORAL | Status: DC

## 2011-06-17 MED ORDER — ASPIRIN EC 325 MG PO TBEC: 325.0000 mg | DELAYED_RELEASE_TABLET | Freq: Every day | ORAL | Status: DC

## 2011-06-17 NOTE — Discharge Instructions (Signed)
Transurethral Resection of the Prostate Care After Refer to this sheet in the next few weeks. These discharge instructions provide you with general information on caring for yourself after you leave the hospital. Your caregiver may also give you specific instructions. Your treatment has been planned according to the most current medical practices available, but unavoidable complications sometimes occur. If you have any problems or questions after discharge, please call your caregiver. HOME CARE INSTRUCTIONS  Medications  You will receive medicine for pain management. As your level of discomfort decreases, adjustments in your pain medicines may be made.   Since it is important that you do deep breathing and coughing exercises and increase your activity, it may be helpful to take pain medicines prior to these activities. Take all medicines as directed.   You may be given a medicine (antibiotic) to kill germs following surgery. Finish all medicines. Let your caregiver know if you have any side effects or problems from the medicine.  Hygiene  You can take a shower after surgery.   You should not take a bath while you still have the urethral catheter.  Activity  You will be encouraged to get out of bed as much as possible and increase your activity level as tolerated.   Spend the first week in and around your home. For 10 days, avoid the following:   Straining.   Running.   Strenuous work.   Walks longer than a few blocks.   Riding for extended periods.   Sexual relations.   Do not lift heavy objects (more than 5 pounds/2.25 kilograms) for at least 1 month. When lifting, use your arms instead of your abdominal muscles.   You will be encouraged to walk as tolerated. Do not exert yourself. Increase your activity level slowly. Remember that it is important to keep moving after an operation of any type. This cuts down on the possibility of developing blood clots.   Your caregiver will  tell you when you can resume driving and light housework. Discuss this at your first office visit after discharge.  Diet  No special diet is ordered after a TURP. However, if you are on a special diet for another medical problem, it should be continued.   Normal fluid intake is usually recommended.   Avoid alcohol and caffeinated drinks for 2 weeks. They irritate the bladder. Decaffeinated drinks are okay.   Avoid spicy foods.  Bladder Function  For the first 10 days, empty the bladder whenever you feel a definite desire. Do not try to hold the urine for long periods of time.   Urinating once or twice a night even after you are healed is not uncommon.   You may see some recurrence of blood in the urine after discharge from the hospital. If this occurs, force fluids again as you did in the hospital and reduce your activity.  Bowel Function  You may experience some constipation after surgery. This can be minimized by increasing fluids and fiber in your diet. Drink enough water and fluids to keep your urine clear or pale yellow.   A stool softener may be prescribed for use at home. Do not strain to move your bowels.   If you are requiring increased pain medicine, it is important that you take stool softeners to prevent constipation. This will help to promote proper healing by reducing the need to strain to move your bowels.  Sexual Activity  Semen movement in the opposite direction and into the bladder (retrograde ejaculation) may   occur. Since the semen passes into the bladder, cloudy urine can occur the first time you urinate after intercourse. Or, you may not have an ejaculation during erection. Ask your caregiver when you can resume sexual activity. Retrograde ejaculation and reduced semen discharge should not reduce one's pleasure of intercourse.  Postoperative Visit  Arrange the date and time of your after surgery visit with your caregiver.  Return to Work  After your recovery is  complete, you will be able to return to work and resume all activities. Your caregiver will inform you when you can return to work.  Foley Catheter Care A soft, flexible tube (Foley catheter) has been placed in your bladder to drain urine and fluid. Follow these instructions: Taking Care of the Catheter  Keep the area where the catheter leaves your body clean.   Attach the catheter to the leg so there is no tension on the catheter.   Keep the drainage bag below the level of the bladder, but keep it OFF the floor.   Do not take long soaking baths. Your caregiver will give instructions about showering.   Wash your hands before touching ANYTHING related to the catheter or bag.   Using mild soap and warm water on a washcloth:   Clean the area closest to the catheter insertion site using a circular motion around the catheter.   Clean the catheter itself by wiping AWAY from the insertion site for several inches down the tube.   NEVER wipe upward as this could sweep bacteria up into the urethra (tube in your body that normally drains the bladder) and cause infection.   Place a small amount of sterile lubricant at the tip of the penis where the catheter is entering.  Taking Care of the Drainage Bags  Two drainage bags will be taken home: a large overnight drainage bag, and a smaller leg bag which fits underneath clothing.   It is okay to wear the overnight bag at any time, but NEVER wear the smaller leg bag at night.   Keep the drainage bag well below the level of your bladder. This prevents backflow of urine into the bladder and allows the urine to drain freely.   Anchor the tubing to your leg to prevent pulling or tension on the catheter. Use tape or a leg strap provided by the hospital.   Empty the drainage bag when it is 1/2 to 3/4 full. Wash your hands before and after touching the bag.   Periodically check the tubing for kinks to make sure there is no pressure on the tubing which  could restrict the flow of urine.  Changing the Drainage Bags  Cleanse both ends of the clean bag with alcohol before changing.   Pinch off the rubber catheter to avoid urine spillage during the disconnection.   Disconnect the dirty bag and connect the clean one.   Empty the dirty bag carefully to avoid a urine spill.   Attach the new bag to the leg with tape or a leg strap.  Cleaning the Drainage Bags  Whenever a drainage bag is disconnected, it must be cleaned quickly so it is ready for the next use.   Wash the bag in warm, soapy water.   Rinse the bag thoroughly with warm water.   Soak the bag for 30 minutes in a solution of white vinegar and water (1 cup vinegar to 1 quart warm water).   Rinse with warm water.  SEEK MEDICAL CARE IF:     You have chills or night sweats.   You are leaking around your catheter or have problems with your catheter. It is not uncommon to have sporadic leakage around your catheter as a result of bladder spasms. If the leakage stops, there is not much need for concern. If you are uncertain, call your caregiver.   You develop side effects that you think are coming from your medicines.  SEEK IMMEDIATE MEDICAL CARE IF:   You are suddenly unable to urinate. Check to see if there are any kinks in the drainage tubing that may cause this. If you cannot find any kinks, call your caregiver immediately. This is an emergency.   You develop shortness of breath or chest pains.   Bleeding persists or clots develop in your urine.   You have a fever.   You develop pain in your back or over your lower belly (abdomen).   You develop pain or swelling in your legs.   Any problems you are having get worse rather than better.  MAKE SURE YOU:   Understand these instructions.   Will watch your condition.   Will get help right away if you are not doing well or get worse.  Document Released: 04/04/2005 Document Revised: 12/15/2010 Document Reviewed:  11/26/2008 [employer] Patient Information 2012 [employer]. 

## 2011-06-17 NOTE — Progress Notes (Signed)
Add labs today bmet normal wbc 8.4 hct 41.3

## 2011-06-17 NOTE — Progress Notes (Signed)
Pt discharged home with wife.   Foley in place with leg bag.  Will follow up with Dr. Jerre Simon on Monday.  Pt and wife verbalize understanding of discharge instructions.

## 2011-06-17 NOTE — Progress Notes (Signed)
Afebrile no complaints cbi is clear. Plan  Dc cbi regular diet oob and discharge home with foley catheter to be removed in office on Monday.

## 2011-06-17 NOTE — Progress Notes (Signed)
CBI discontinued. Urine is clear, pink tinged.  Occasional small clot noted in foley tubing.  Drainage bag changed to a leg bag, and pt and wife instructed on how to use.  Pt states he is ready to go home.  MD plans to discharge today

## 2011-06-17 NOTE — Addendum Note (Signed)
Addendum  created 06/17/11 1041 by Glynn Octave, CRNA   Modules edited:Notes Section

## 2011-06-17 NOTE — Progress Notes (Signed)
Patient has done great throughout the night.  He has slept well and urine appears normal.  CBI has been titrated down as the urine got lighter during the night.  He has complained of no well and did not need the Ambien last evening.

## 2011-06-19 ENCOUNTER — Encounter (HOSPITAL_COMMUNITY): Payer: Self-pay | Admitting: Anesthesiology

## 2011-06-19 ENCOUNTER — Encounter (HOSPITAL_COMMUNITY): Payer: Self-pay | Admitting: *Deleted

## 2011-06-19 ENCOUNTER — Emergency Department (HOSPITAL_COMMUNITY)
Admission: EM | Admit: 2011-06-19 | Discharge: 2011-06-19 | Disposition: A | Discharge status: Home / Self Care | Payer: 59 | Attending: Emergency Medicine | Admitting: Emergency Medicine

## 2011-06-19 ENCOUNTER — Emergency Department (HOSPITAL_COMMUNITY): Payer: 59 | Admitting: Anesthesiology

## 2011-06-19 ENCOUNTER — Inpatient Hospital Stay (HOSPITAL_COMMUNITY)
Admission: EM | Admit: 2011-06-19 | Discharge: 2011-06-21 | DRG: 921 | Disposition: A | Discharge status: Home / Self Care | Payer: 59 | Attending: Urology | Admitting: Urology

## 2011-06-19 ENCOUNTER — Encounter (HOSPITAL_COMMUNITY)
Admission: EM | Disposition: A | Discharge status: Home / Self Care | Payer: Self-pay | Source: Home / Self Care | Attending: Urology

## 2011-06-19 DIAGNOSIS — R319 Hematuria, unspecified: Secondary | ICD-10-CM | POA: Insufficient documentation

## 2011-06-19 DIAGNOSIS — J449 Chronic obstructive pulmonary disease, unspecified: Secondary | ICD-10-CM | POA: Diagnosis present

## 2011-06-19 DIAGNOSIS — I10 Essential (primary) hypertension: Secondary | ICD-10-CM | POA: Diagnosis present

## 2011-06-19 DIAGNOSIS — F3289 Other specified depressive episodes: Secondary | ICD-10-CM | POA: Diagnosis present

## 2011-06-19 DIAGNOSIS — Z79899 Other long term (current) drug therapy: Secondary | ICD-10-CM | POA: Insufficient documentation

## 2011-06-19 DIAGNOSIS — Z7982 Long term (current) use of aspirin: Secondary | ICD-10-CM | POA: Insufficient documentation

## 2011-06-19 DIAGNOSIS — Z9889 Other specified postprocedural states: Secondary | ICD-10-CM | POA: Insufficient documentation

## 2011-06-19 DIAGNOSIS — R339 Retention of urine, unspecified: Secondary | ICD-10-CM | POA: Insufficient documentation

## 2011-06-19 DIAGNOSIS — F17200 Nicotine dependence, unspecified, uncomplicated: Secondary | ICD-10-CM | POA: Diagnosis present

## 2011-06-19 DIAGNOSIS — Z87438 Personal history of other diseases of male genital organs: Secondary | ICD-10-CM

## 2011-06-19 DIAGNOSIS — N400 Enlarged prostate without lower urinary tract symptoms: Secondary | ICD-10-CM | POA: Diagnosis present

## 2011-06-19 DIAGNOSIS — F329 Major depressive disorder, single episode, unspecified: Secondary | ICD-10-CM | POA: Insufficient documentation

## 2011-06-19 DIAGNOSIS — F4310 Post-traumatic stress disorder, unspecified: Secondary | ICD-10-CM | POA: Diagnosis present

## 2011-06-19 DIAGNOSIS — J4489 Other specified chronic obstructive pulmonary disease: Secondary | ICD-10-CM | POA: Diagnosis present

## 2011-06-19 DIAGNOSIS — IMO0002 Reserved for concepts with insufficient information to code with codable children: Principal | ICD-10-CM | POA: Diagnosis present

## 2011-06-19 DIAGNOSIS — Y836 Removal of other organ (partial) (total) as the cause of abnormal reaction of the patient, or of later complication, without mention of misadventure at the time of the procedure: Secondary | ICD-10-CM | POA: Diagnosis present

## 2011-06-19 DIAGNOSIS — M459 Ankylosing spondylitis of unspecified sites in spine: Secondary | ICD-10-CM | POA: Diagnosis present

## 2011-06-19 LAB — BASIC METABOLIC PANEL
CO2: 27 mEq/L (ref 19–32)
Glucose, Bld: 114 mg/dL — ABNORMAL HIGH (ref 70–99)
Potassium: 3.6 mEq/L (ref 3.5–5.1)
Sodium: 138 mEq/L (ref 135–145)

## 2011-06-19 LAB — CBC
Hemoglobin: 16.5 g/dL (ref 13.0–17.0)
RBC: 4.97 MIL/uL (ref 4.22–5.81)
WBC: 13.1 10*3/uL — ABNORMAL HIGH (ref 4.0–10.5)

## 2011-06-19 SURGERY — CYSTO
Anesthesia: Spinal | Wound class: Clean Contaminated

## 2011-06-19 MED ORDER — PHENYLEPHRINE HCL 10 MG/ML IJ SOLN
INTRAMUSCULAR | Status: DC | PRN
  Administered 2011-06-19: 100 ug via INTRAVENOUS
  Administered 2011-06-19: 50 ug via INTRAVENOUS
  Administered 2011-06-19 (×2): 100 ug via INTRAVENOUS

## 2011-06-19 MED ORDER — BUPIVACAINE HCL 0.75 % IJ SOLN
INTRAMUSCULAR | Status: DC | PRN
  Administered 2011-06-19: 12.5 mg via INTRATHECAL

## 2011-06-19 MED ORDER — ONDANSETRON 8 MG PO TBDP
ORAL_TABLET | ORAL | Status: AC
  Administered 2011-06-19: 8 mg via ORAL
  Filled 2011-06-19: qty 1

## 2011-06-19 MED ORDER — GLYCINE 1.5 % IR SOLN
Status: DC | PRN
  Administered 2011-06-19: 21000 mL

## 2011-06-19 MED ORDER — FENTANYL CITRATE 0.05 MG/ML IJ SOLN
INTRAMUSCULAR | Status: DC | PRN
  Administered 2011-06-19: 50 ug via INTRAVENOUS
  Administered 2011-06-19: 25 ug via INTRATHECAL

## 2011-06-19 MED ORDER — HYDROMORPHONE HCL PF 2 MG/ML IJ SOLN
INTRAMUSCULAR | Status: AC
  Administered 2011-06-19: 2 mg via INTRAMUSCULAR
  Filled 2011-06-19: qty 1

## 2011-06-19 MED ORDER — MORPHINE SULFATE 2 MG/ML IJ SOLN
2.0000 mg | INTRAMUSCULAR | Status: DC | PRN
  Administered 2011-06-19 – 2011-06-21 (×8): 2 mg via INTRAVENOUS
  Filled 2011-06-19 (×8): qty 1

## 2011-06-19 MED ORDER — ONDANSETRON 8 MG PO TBDP
8.0000 mg | ORAL_TABLET | Freq: Once | ORAL | Status: AC
  Administered 2011-06-19: 8 mg via ORAL

## 2011-06-19 MED ORDER — HYDROMORPHONE HCL PF 2 MG/ML IJ SOLN
2.0000 mg | Freq: Once | INTRAMUSCULAR | Status: AC
  Administered 2011-06-19: 2 mg via INTRAMUSCULAR

## 2011-06-19 MED ORDER — LACTATED RINGERS IV SOLN
INTRAVENOUS | Status: DC | PRN
  Administered 2011-06-19 (×2): via INTRAVENOUS

## 2011-06-19 MED ORDER — HYDROMORPHONE HCL PF 1 MG/ML IJ SOLN
INTRAMUSCULAR | Status: AC
  Administered 2011-06-19: 1 mg via INTRAMUSCULAR
  Filled 2011-06-19: qty 1

## 2011-06-19 MED ORDER — MORPHINE SULFATE 2 MG/ML IJ SOLN
2.0000 mg | Freq: Once | INTRAMUSCULAR | Status: AC
  Administered 2011-06-19: 2 mg via INTRAVENOUS

## 2011-06-19 MED ORDER — HYDROMORPHONE HCL PF 1 MG/ML IJ SOLN
1.0000 mg | Freq: Once | INTRAMUSCULAR | Status: AC
  Administered 2011-06-19: 1 mg via INTRAMUSCULAR

## 2011-06-19 MED ORDER — CIPROFLOXACIN IN D5W 200 MG/100ML IV SOLN
INTRAVENOUS | Status: AC
  Filled 2011-06-19: qty 100

## 2011-06-19 MED ORDER — LISINOPRIL-HYDROCHLOROTHIAZIDE 20-12.5 MG PO TABS: 2.0000 | ORAL_TABLET | Freq: Every day | ORAL | Status: DC

## 2011-06-19 MED ORDER — CIPROFLOXACIN IN D5W 200 MG/100ML IV SOLN
200.0000 mg | INTRAVENOUS | Status: DC
  Administered 2011-06-20: 200 mg via INTRAVENOUS
  Filled 2011-06-19 (×3): qty 100

## 2011-06-19 MED ORDER — DEXTROSE-NACL 5-0.45 % IV SOLN
INTRAVENOUS | Status: DC
  Administered 2011-06-19: 950 mL via INTRAVENOUS
  Administered 2011-06-19 – 2011-06-20 (×2): 1000 mL via INTRAVENOUS

## 2011-06-19 MED ORDER — STERILE WATER FOR IRRIGATION IR SOLN
Status: DC | PRN
  Administered 2011-06-19: 1000 mL

## 2011-06-19 MED ORDER — EPHEDRINE SULFATE 50 MG/ML IJ SOLN
INTRAMUSCULAR | Status: DC | PRN
  Administered 2011-06-19: 5 mg via INTRAVENOUS
  Administered 2011-06-19: 10 mg via INTRAVENOUS

## 2011-06-19 MED ORDER — METOPROLOL TARTRATE 1 MG/ML IV SOLN
INTRAVENOUS | Status: DC | PRN
  Administered 2011-06-19 (×3): 1 mg via INTRAVENOUS

## 2011-06-19 MED ORDER — HYDROCHLOROTHIAZIDE 25 MG PO TABS
25.0000 mg | ORAL_TABLET | Freq: Every day | ORAL | Status: DC
  Administered 2011-06-19 – 2011-06-21 (×3): 25 mg via ORAL
  Filled 2011-06-19 (×3): qty 1

## 2011-06-19 MED ORDER — CIPROFLOXACIN IN D5W 200 MG/100ML IV SOLN
200.0000 mg | Freq: Once | INTRAVENOUS | Status: AC
  Administered 2011-06-19: 200 mg via INTRAVENOUS

## 2011-06-19 MED ORDER — MIDAZOLAM HCL 5 MG/5ML IJ SOLN
INTRAMUSCULAR | Status: DC | PRN
  Administered 2011-06-19: 2 mg via INTRAVENOUS

## 2011-06-19 MED ORDER — PROPOFOL 10 MG/ML IV EMUL
INTRAVENOUS | Status: DC | PRN
  Administered 2011-06-19: 25 ug/kg/min via INTRAVENOUS

## 2011-06-19 MED ORDER — LISINOPRIL 10 MG PO TABS
40.0000 mg | ORAL_TABLET | Freq: Every day | ORAL | Status: DC
  Administered 2011-06-19 – 2011-06-21 (×3): 40 mg via ORAL
  Filled 2011-06-19 (×3): qty 4

## 2011-06-19 MED ORDER — GLYCINE 1.5 % IR SOLN
Status: DC | PRN
  Administered 2011-06-19: 9000 mL

## 2011-06-19 MED ORDER — MORPHINE SULFATE 2 MG/ML IJ SOLN
INTRAMUSCULAR | Status: AC
  Filled 2011-06-19: qty 1

## 2011-06-19 SURGICAL SUPPLY — 40 items
BAG DECANTER FOR FLEXI CONT (MISCELLANEOUS) ×2 IMPLANT
BAG DRAIN URO TABLE W/ADPT NS (DRAPE) ×2 IMPLANT
BAG DRN 8 ADPR NS SKTRN CSTL (DRAPE) ×1
BAG DRN URN TUBE DRIP CHMBR (OSTOMY) ×1
BAG URINE DRAIN TURP 4L (OSTOMY) ×2 IMPLANT
CABLE HI FREQUENCY MONOPOLAR (ELECTROSURGICAL) ×2 IMPLANT
CATH 3WAY 30CC 24FR (CATHETERS) ×1 IMPLANT
CATH FOLEY 3WAY 30CC 22F (CATHETERS) ×1 IMPLANT
CLOTH BEACON ORANGE TIMEOUT ST (SAFETY) ×2 IMPLANT
CONNECTOR 5 IN 1 STRAIGHT STRL (MISCELLANEOUS) ×1 IMPLANT
DRAPE STERI URO 23X35 APER SZ5 (DRAPE) ×2 IMPLANT
ELECT CUT LOOP C-MAX 27FR .012 (CUTTING LOOP) ×2
ELECT REM PT RETURN 9FT ADLT (ELECTROSURGICAL) ×2
ELECTRODE BALL 24/28FR 5MM (UROLOGICAL SUPPLIES) ×1 IMPLANT
ELECTRODE CUT LP CMX 27FR .012 (CUTTING LOOP) IMPLANT
ELECTRODE REM PT RTRN 9FT ADLT (ELECTROSURGICAL) ×1 IMPLANT
FLOOR PAD 36X40 (MISCELLANEOUS)
FORMALIN 10 PREFIL 480ML (MISCELLANEOUS) ×1 IMPLANT
GLOVE BIO SURGEON STRL SZ7 (GLOVE) ×2 IMPLANT
GLOVE ECLIPSE 6.5 STRL STRAW (GLOVE) ×1 IMPLANT
GLOVE EXAM NITRILE MD LF STRL (GLOVE) ×1 IMPLANT
GLOVE INDICATOR 6.5 STRL GRN (GLOVE) ×1 IMPLANT
GLOVE INDICATOR 7.0 STRL GRN (GLOVE) ×1 IMPLANT
GLYCINE 1.5% IRRIG UROMATIC (IV SOLUTION) ×14 IMPLANT
GOWN STRL REIN XL XLG (GOWN DISPOSABLE) ×3 IMPLANT
IV NS IRRIG 3000ML ARTHROMATIC (IV SOLUTION) ×2 IMPLANT
KIT ROOM TURNOVER AP CYSTO (KITS) ×2 IMPLANT
MANIFOLD NEPTUNE II (INSTRUMENTS) ×2 IMPLANT
PACK CYSTO (CUSTOM PROCEDURE TRAY) ×2 IMPLANT
PAD ARMBOARD 7.5X6 YLW CONV (MISCELLANEOUS) ×2 IMPLANT
PAD FLOOR 36X40 (MISCELLANEOUS) ×1 IMPLANT
SET IRRIGATING DISP (SET/KITS/TRAYS/PACK) ×2 IMPLANT
SPONGE GAUZE 4X4 12PLY (GAUZE/BANDAGES/DRESSINGS) ×1 IMPLANT
SYR 30ML LL (SYRINGE) ×2 IMPLANT
SYRINGE IRR TOOMEY STRL 70CC (SYRINGE) ×1 IMPLANT
TAPE CLOTH SILK CARING 3INX10 (GAUZE/BANDAGES/DRESSINGS) ×1 IMPLANT
TOWEL OR 17X26 4PK STRL BLUE (TOWEL DISPOSABLE) ×2 IMPLANT
WATER STERILE IRR 1000ML POUR (IV SOLUTION) ×2 IMPLANT
XPEEDA 550 SIDEFIRING FIBER (MISCELLANEOUS) IMPLANT
YANKAUER SUCT BULB TIP 10FT TU (MISCELLANEOUS) ×2 IMPLANT

## 2011-06-19 NOTE — Anesthesia Postprocedure Evaluation (Addendum)
  Anesthesia Post-op Note  Patient: Samuel Long  Procedure(s) Performed: Procedure(s) (LRB): TRANSURETHRAL RESECTION OF THE PROSTATE (TURP) (N/A)  Patient Location: PACU  Anesthesia Type: Spinal  Level of Consciousness: awake, alert  and oriented  Airway and Oxygen Therapy: Patient Spontanous Breathing and Patient connected to face mask oxygen  Post-op Pain: none  Post-op Assessment: Post-op Vital signs reviewed, Patient's Cardiovascular Status Stable, Respiratory Function Stable, Patent Airway and No signs of Nausea or vomiting  Post-op Vital Signs: Reviewed and stable  Complications: No apparent anesthesia complications Spinal at T10 06/20/11  Patient doing well.  VSS.  Denies headache and backpain.  No apparent anesthesia complications.

## 2011-06-19 NOTE — ED Notes (Signed)
Unable to irrigate foley upon pts arrival. Pt c/o extreme pain when attempting to irrigate also.  PA to bedside.Urology cart to bedside. Foley removed with clots following. 86F attempted and 65F cudae by Hyacinth Meeker, RA unable to pass. Dr. Jerre Simon notified. Dr. Jerre Simon to come in and reinsert foley.

## 2011-06-19 NOTE — ED Provider Notes (Addendum)
History     CSN: [id number]  Arrival date & time 06/19/11  0434   First MD Initiated Contact with Patient 06/19/11 [phone]      Chief Complaint  Patient presents with  . Hematuria    (Consider location/radiation/quality/duration/timing/severity/associated sxs/prior treatment) Patient is a 66 y.o. male presenting with hematuria.  Hematuria This is a recurrent problem. The current episode started today. The problem is unchanged. He describes the hematuria as gross hematuria. His pain is at a severity of 0/10. He is experiencing no pain. He describes his urine color as dark red. Pertinent negatives include no abdominal pain, chills, fever, nausea or vomiting. His past medical history is significant for BPH.   Patient status post TURP by urology on Thursday bleeding it cleared earlier in the day and then this morning woke up with bleeding around the Foley catheter and lots of red blood in the urine again. Bladder was filling fall but recently more fluid went into the leg bag a Foley catheter and now bladder doesn't feel full.  Past Medical History  Diagnosis Date  . Spondylitis, ankylosing   . Hypertension   . BPH (benign prostatic hyperplasia)   . Depression   . PTSD (post-traumatic stress disorder)   . Cataracts, bilateral   . Left rib fracture     from recent fall    Past Surgical History  Procedure Date  . Bone cyst excision     left foot, [name]  . Transurethral resection of prostate     Family History  Problem Relation Age of Onset  . Anesthesia problems Neg Hx   . Hypotension Neg Hx   . Malignant hyperthermia Neg Hx   . Pseudochol deficiency Neg Hx     History  Substance Use Topics  . Smoking status: Current Everyday Smoker -- 0.5 packs/day for 40 years    Types: Cigarettes  . Smokeless tobacco: Never Used  . Alcohol Use: Yes     2 fifths of bourbon/wk      Review of Systems  Constitutional: Negative for fever and chills.  HENT: Negative for congestion.     Respiratory: Negative for cough and shortness of breath.   Cardiovascular: Negative for chest pain.  Gastrointestinal: Negative for nausea, vomiting and abdominal pain.  Genitourinary: Positive for hematuria and decreased urine volume. Negative for scrotal swelling.  Musculoskeletal: Negative for back pain.  Skin: Negative for rash.  Neurological: Negative for headaches.  Hematological: Does not bruise/bleed easily.    Allergies  Review of patient's allergies indicates no known allergies.  Home Medications   Current Outpatient Rx  Name Route Sig Dispense Refill  . ASPIRIN EC 325 MG PO TBEC Oral Take 1 tablet (325 mg total) by mouth daily. 30 tablet   . FENOFIBRATE 145 MG PO TABS Oral Take 1 tablet (145 mg total) by mouth daily. 30 tablet 0  . OMEGA-3 FATTY ACIDS 1000 MG PO CAPS Oral Take 2 g by mouth daily.    Marland Kitchen FLAX SEED OIL 1000 MG PO CAPS Oral Take 2,000 mg by mouth daily.    Marland Kitchen HYDROCODONE-IBUPROFEN 7.5-200 MG PO TABS Oral Take 1 tablet by mouth every 6 (six) hours as needed. For arthritis pain 30 tablet   . LISINOPRIL-HYDROCHLOROTHIAZIDE 20-12.5 MG PO TABS Oral Take 2 tablets by mouth daily.    Marland Kitchen METOPROLOL SUCCINATE ER 50 MG PO TB24 Oral Take 50 mg by mouth daily. Take with or immediately following a meal.    . ADULT MULTIVITAMIN W/MINERALS CH Oral  Take 1 tablet by mouth daily.    Marland Kitchen TAMSULOSIN HCL 0.4 MG PO CAPS Oral Take 1 capsule (0.4 mg total) by mouth daily. 30 capsule     BP 165/94  Pulse 78  Temp(Src) 97.8 F (36.6 C) (Oral)  Resp 20  Ht 5\' 9"  (1.753 m)  Wt 180 lb (81.647 kg)  BMI 26.58 kg/m2  SpO2 99%  Physical Exam  Nursing note and vitals reviewed. Constitutional: He is oriented to person, place, and time. He appears well-developed and well-nourished. No distress.  HENT:  Head: Normocephalic and atraumatic.  Mouth/Throat: Oropharynx is clear and moist.  Eyes: Conjunctivae and EOM are normal. Pupils are equal, round, and reactive to light.  Neck: Normal  range of motion. Neck supple.  Cardiovascular: Normal rate, regular rhythm and normal heart sounds.   No murmur heard. Pulmonary/Chest: Effort normal and breath sounds normal.  Abdominal: Soft. Bowel sounds are normal. There is no tenderness.  Genitourinary: Penis normal.       Irrigating Foley in place with leg bag gross hematuria some clots in the bag. No scrotal swelling.  Musculoskeletal: Normal range of motion.  Neurological: He is alert and oriented to person, place, and time. No cranial nerve deficit. He exhibits normal muscle tone. Coordination normal.  Skin: Skin is warm. No rash noted.    ED Course  Procedures (including critical care time)  Labs Reviewed - No data to display No results found.   1. Hematuria       MDM   Patient status post TURP on Thursday by Dr. Frann Rider, woke up this morning with bleeding around the Foley catheter and bright red blood noted in the bag the bleeding had cleared earlier in the day for the first time. TURP was done for benign prostatic hyperplasia.   Here in the emergency department there was bloody urine in this leg bag we removed this and irrigated and placed a larger bag were able to irrigate some clots fluid was returning we'll observe her for a period time to make sure it still drains the drains well may be discharged home patient will return if the clots off again and will followup with urology on Monday. Patient is not on Coumadin or any blood thinners.  Suspect they'll Foley catheter may have gotten pulled on during the night or perhaps rebleeding occurred and clot formed inside the bladder.   Addendum: Current time is 6:18 AM. [place] now with good flow of urine into the larger Foley bag still grossly bloody bladder emptying well as per patient. Wife given instructions on how to irrigate. She wants to go home with a larger bags we does not empty is often. Patient already has scheduled followup with urology at 10 AM on  Monday.       Shelda Jakes, MD 06/19/11 1610  Shelda Jakes, MD 06/19/11 [phone]

## 2011-06-19 NOTE — Anesthesia Preprocedure Evaluation (Addendum)
Anesthesia Evaluation  Patient identified by MRN, date of birth, ID band Patient awake  General Assessment Comment:Patient on lopressor q 24 hours, no dose today.  Will give small increments of lopressor IV preop and as needed.  Reviewed: Allergy & Precautions, H&P , NPO status , Patient's Chart, lab work & pertinent test results, reviewed documented beta blocker date and time   History of Anesthesia Complications Negative for: history of anesthetic complications  Airway Mallampati: I  Neck ROM: Limited    Dental  (+) Teeth Intact   Pulmonary COPDCurrent Smoker,          Cardiovascular hypertension, Pt. on medications Rhythm:Regular Rate:Normal     Neuro/Psych PTSD   GI/Hepatic negative GI ROS, Neg liver ROS,   Endo/Other  negative endocrine ROS  Renal/GU TURP 2/28    Foley catheter with irrigation placed in ED    Musculoskeletal   Abdominal Normal abdominal exam  (+)   Peds  Hematology   Anesthesia Other Findings   Reproductive/Obstetrics                         Anesthesia Physical Anesthesia Plan  ASA: III  Anesthesia Plan: Spinal   Post-op Pain Management:    Induction:   Airway Management Planned: Simple Face Mask  Additional Equipment:   Intra-op Plan:   Post-operative Plan:   Informed Consent: I have reviewed the patients History and Physical, chart, labs and discussed the procedure including the risks, benefits and alternatives for the proposed anesthesia with the patient or authorized representative who has indicated his/her understanding and acceptance.     Plan Discussed with: Anesthesiologist  Anesthesia Plan Comments: (Telephone consult with Dr. Jayme Cloud.  Patient is agreeable to another spinal.)        Anesthesia Quick Evaluation

## 2011-06-19 NOTE — ED Notes (Signed)
Pt to OR.

## 2011-06-19 NOTE — ED Notes (Addendum)
Irrigated with 100 ml NS.  100 ml return.  Several clots returned as well.  Pt still having some drainage around catheter as well. Bedside drainage bag connected and draining to gravity. Instruction given to pt and wife on how to irrigate.  Wife verbalized understanding and returned demonstration.

## 2011-06-19 NOTE — ED Notes (Signed)
Pt had TURP Thursday with Dr. Frann Rider, woke up this am with bleeding around foley cath, bright red blood noted upon exam

## 2011-06-19 NOTE — Anesthesia Procedure Notes (Signed)
Spinal  Patient location during procedure: OR Start time: 06/19/2011 12:45 PM Staffing CRNA/Resident: Glynn Octave Preanesthetic Checklist Completed: patient identified, site marked, surgical consent, pre-op evaluation, timeout performed, IV checked, risks and benefits discussed and monitors and equipment checked Spinal Block Patient position: right lateral decubitus Prep: Betadine Patient monitoring: heart rate, cardiac monitor, continuous pulse ox and blood pressure Approach: right paramedian Location: L3-4 Injection technique: single-shot Needle Needle type: Spinocan  Needle gauge: 22 G Needle length: 9 cm Assessment Sensory level: T8 Additional Notes ATTEMPTS:1 TRAY [id number] TRAY EXPIRATION DATE:02/2012

## 2011-06-19 NOTE — Consult Note (Signed)
Report#[id number]

## 2011-06-19 NOTE — Discharge Instructions (Signed)
If the Foley and urine stops draining will need to return. Otherwise followup with urology at least by phone on Monday. Return for any new or worse symptoms.

## 2011-06-19 NOTE — ED Provider Notes (Signed)
History     CSN: [id number]  Arrival date & time 06/19/11  7829   First MD Initiated Contact with Patient 06/19/11 0845      Chief Complaint  Patient presents with  . Hematuria    (Consider location/radiation/quality/duration/timing/severity/associated sxs/prior treatment) HPI Comments: Pt had TURP 3 days ago by dr. Jerre Simon.  He has had indwelling catheter since then.  Had blood clots evident last PM with foley blockage.  Came to ED and had irrigation and clearing of urine. Discharged home.  He has returned this AM with complete catheter blockage and bleeding evident in bag and around foley.  C/o bladder fullness.  Patient is a 66 y.o. male presenting with hematuria. The history is provided by the patient and the spouse. No language interpreter was used.  Hematuria This is a new problem. The current episode started today. The problem has been gradually worsening since onset. He describes the hematuria as gross hematuria. The pain is severe. He describes his urine color as dark red. Irritative symptoms include urgency. Irritative symptoms do not include frequency. Associated symptoms include inability to urinate. Pertinent negatives include no dysuria or flank pain. His sexual activity is non-contributory to the current illness. His past medical history is significant for BPH.    Past Medical History  Diagnosis Date  . Spondylitis, ankylosing   . Hypertension   . BPH (benign prostatic hyperplasia)   . Depression   . PTSD (post-traumatic stress disorder)   . Cataracts, bilateral   . Left rib fracture     from recent fall    Past Surgical History  Procedure Date  . Bone cyst excision     left foot, [name]  . Transurethral resection of prostate     Family History  Problem Relation Age of Onset  . Anesthesia problems Neg Hx   . Hypotension Neg Hx   . Malignant hyperthermia Neg Hx   . Pseudochol deficiency Neg Hx     History  Substance Use Topics  . Smoking status: Current  Everyday Smoker -- 0.5 packs/day for 40 years    Types: Cigarettes  . Smokeless tobacco: Never Used  . Alcohol Use: Yes     2 fifths of bourbon/wk      Review of Systems  Genitourinary: Positive for urgency, hematuria and difficulty urinating. Negative for dysuria, frequency and flank pain.  All other systems reviewed and are negative.    Allergies  Review of patient's allergies indicates no known allergies.  Home Medications   Current Outpatient Rx  Name Route Sig Dispense Refill  . ASPIRIN EC 325 MG PO TBEC Oral Take 1 tablet (325 mg total) by mouth daily. 30 tablet   . FENOFIBRATE 145 MG PO TABS Oral Take 1 tablet (145 mg total) by mouth daily. 30 tablet 0  . OMEGA-3 FATTY ACIDS 1000 MG PO CAPS Oral Take 2 g by mouth daily.    Marland Kitchen FLAX SEED OIL 1000 MG PO CAPS Oral Take 2,000 mg by mouth daily.    Marland Kitchen HYDROCODONE-IBUPROFEN 7.5-200 MG PO TABS Oral Take 1 tablet by mouth every 6 (six) hours as needed. For arthritis pain 30 tablet   . LISINOPRIL-HYDROCHLOROTHIAZIDE 20-12.5 MG PO TABS Oral Take 2 tablets by mouth daily.    Marland Kitchen METOPROLOL SUCCINATE ER 50 MG PO TB24 Oral Take 50 mg by mouth daily. Take with or immediately following a meal.    . ADULT MULTIVITAMIN W/MINERALS CH Oral Take 1 tablet by mouth daily.    Marland Kitchen TAMSULOSIN  HCL 0.4 MG PO CAPS Oral Take 1 capsule (0.4 mg total) by mouth daily. 30 capsule     BP 162/106  Pulse 98  Temp(Src) 97.8 F (36.6 C) (Oral)  Resp 22  SpO2 100%  Physical Exam  Nursing note and vitals reviewed. Constitutional: He is oriented to person, place, and time. He appears well-developed and well-nourished.  HENT:  Head: Normocephalic and atraumatic.  Eyes: EOM are normal.  Neck: Normal range of motion.  Cardiovascular: Normal rate, regular rhythm, normal heart sounds and intact distal pulses.   Pulmonary/Chest: Effort normal and breath sounds normal. No respiratory distress.  Abdominal: Soft. He exhibits no distension. There is no tenderness.    Genitourinary: Testes normal and penis normal. Circumcised.       Catheter removed because it is non-functional.  Several large clots removed concurrently.  Attempted insertion of 22 Fr. Triple lumen, then 14 Fr, then 16Fr coudae ; all without success.    Musculoskeletal: Normal range of motion.  Neurological: He is alert and oriented to person, place, and time.  Skin: Skin is warm and dry.  Psychiatric: He has a normal mood and affect. Judgment normal.    ED Course  Procedures (including critical care time)  Labs Reviewed - No data to display No results found.   No diagnosis found.    MDM  Dr. Jerre Simon contacted and is coming in to perform catheter insertion.        Worthy Rancher, PA 06/24/11 1400

## 2011-06-19 NOTE — ED Notes (Signed)
Pt had TURP on Thursday. Had blood in his foley during the night and was seen here, cath was flushed and pt states blood returned as soon as he got home. Pt states pain (feels like bladder is full but it wont come out) 300 ml bloody return currently in foley on arrival.

## 2011-06-19 NOTE — Brief Op Note (Signed)
06/19/2011  1:51 PM  PATIENT:  Samuel Long  66 y.o. male  PRE-OPERATIVE DIAGNOSIS:   blood  clots   POST-OPERATIVE DIAGNOSIS:  * No post-op diagnosis entered *  PROCEDURE:  Procedure(s) (LRB): TRANSURETHRAL RESECTION OF THE PROSTATE (TURP) (N/A)  SURGEON:  Surgeon(s) and Role:    * Ky Barban, MD - Primary  PHYSICIAN ASSISTANT:   ASSISTANTS: none   ANESTHESIA:   spinal  EBL:  Total I/O In: 1000 [I.V.:1000] Out: 3050 [Urine:3000; Blood:50]  BLOOD ADMINISTERED:none  DRAINS: Urinary Catheter (Foley)   LOCAL MEDICATIONS USED:  NONE  SPECIMEN:  No Specimen  DISPOSITION OF SPECIMEN:  N/A  COUNTS:  YES  TOURNIQUET:  * No tourniquets in log *  DICTATION: .Other Dictation: Dictation Number ditation#      [id number]  PLAN OF CARE: Admit for overnight observation  PATIENT DISPOSITION:  PACU - hemodynamically stable.   Delay start of Pharmacological VTE agent (>24hrs) due to surgical blood loss or risk of bleeding:

## 2011-06-19 NOTE — ED Notes (Signed)
RN at patients bedside at this time.

## 2011-06-19 NOTE — ED Notes (Signed)
Pt reports recent TURP.  Currently leg bag in place, draining cherry colored urine.  Blood clots noted around glans and in urinary drainage bag.

## 2011-06-19 NOTE — Transfer of Care (Signed)
Immediate Anesthesia Transfer of Care Note  Patient: Samuel Long  Procedure(s) Performed: Procedure(s) (LRB): TRANSURETHRAL RESECTION OF THE PROSTATE (TURP) (N/A)  Patient Location: PACU  Anesthesia Type: Spinal  Level of Consciousness: awake, alert  and oriented  Airway & Oxygen Therapy: Patient Spontanous Breathing and Patient connected to face mask oxygen  Post-op Assessment: Report given to PACU RN  Post vital signs: Reviewed and stable  Complications: No apparent anesthesia complications

## 2011-06-20 LAB — DIFFERENTIAL
Basophils Absolute: 0 10*3/uL (ref 0.0–0.1)
Basophils Relative: 0 % (ref 0–1)
Eosinophils Absolute: 0.6 10*3/uL (ref 0.0–0.7)
Eosinophils Relative: 6 % — ABNORMAL HIGH (ref 0–5)
Lymphs Abs: 1.3 10*3/uL (ref 0.7–4.0)
Neutrophils Relative %: 69 % (ref 43–77)

## 2011-06-20 LAB — BASIC METABOLIC PANEL
Calcium: 9.4 mg/dL (ref 8.4–10.5)
GFR calc Af Amer: >90 mL/min (ref >90)
GFR calc non Af Amer: >90 mL/min (ref >90)
Potassium: 3 mEq/L — ABNORMAL LOW (ref 3.5–5.1)
Sodium: 136 mEq/L (ref 135–145)

## 2011-06-20 LAB — CBC
MCH: 32.7 pg (ref 26.0–34.0)
MCV: 94.6 fL (ref 78.0–100.0)
Platelets: 325 10*3/uL (ref 150–400)
RBC: 4.07 MIL/uL — ABNORMAL LOW (ref 4.22–5.81)
RDW: 13.9 % (ref 11.5–15.5)
WBC: 10.1 10*3/uL (ref 4.0–10.5)

## 2011-06-20 MED ORDER — SODIUM CHLORIDE 0.9 % IJ SOLN
INTRAMUSCULAR | Status: AC
  Administered 2011-06-20: 10 mL
  Filled 2011-06-20: qty 3

## 2011-06-20 NOTE — Op Note (Signed)
[name], Samuel Long NO.:  [id number]  MEDICAL RECORD NO.:  [id number]  LOCATION:  APA08                         FACILITY:  [facility]  PHYSICIAN:  Ky Barban, M.D.DATE OF BIRTH:  Jan 02, 1946  DATE OF PROCEDURE: DATE OF DISCHARGE:  06/19/2011                              OPERATIVE REPORT   PREOPERATIVE DIAGNOSIS:  Secondary hemorrhage from transurethral resection of the prostate.  POSTOP DIAGNOSIS:  Secondary hemorrhage from transurethral resection of the prostate.  PROCEDURE:  Cystoscopy, evacuation of blood clot, and fulguration of the bleeder.  ANESTHESIA:  Spinal.  PROCEDURE:  The patient under spinal anesthesia, in lithotomy position, usual prep and drape. A #28 Iglesias resectoscope was introduced into the bladder.  Large amount of blood clots were evacuated and I have done cystoscopy at the end.  No blood clots in the bladder.  Some of the bleeders in the prostatic urethra were fulgurated with the help of a roller electrode.  At the end, I removed the resectoscope and 24 three- way Foley catheter left in for drainage.  CBI started which is clear. Balloon was inflated to 40 mL and without fractionated, absolutely clear.  The patient left the operating room in satisfactory condition.     Ky Barban, M.D.     MIJ/MEDQ  D:  06/19/2011  T:  06/20/2011  Job:  [id number]

## 2011-06-20 NOTE — Progress Notes (Signed)
NAMEJADRIEN, NARINE NO.:  [id number]  MEDICAL RECORD NO.:  [id number]  LOCATION:  APA08                         FACILITY:  [facility]  PHYSICIAN:  Ky Barban, M.D.DATE OF BIRTH:  09/16/1945  DATE OF PROCEDURE: DATE OF DISCHARGE:  06/19/2011                                PROGRESS NOTE   Mr. Burtch is a 66 year old gentleman, couple of days ago I have done a TUR prostate for BPH.  He was discharged on Friday with very clear urine but last night he came back to the emergency room, having hematuria in the Foley catheter.  I had sent him home with a catheter, so I can take the catheter out on Monday in the office but last night he clotted the catheter, came here, changed the catheter, irrigated the bladder, and apparently it was draining fine, sent him home, but it clotted back again.  So he came back again this morning.  I have irrigated the bladder.  It looks like he has lot of blood clots in his bladder and it is not irrigating right, so I am going to take him to the operating room so that I can evacuate blood clots under anesthesia and cauterize the bleeder.  I have discussed this with the patient and his wife.  They understand and want me to go ahead and proceed with it.  PHYSICAL EXAMINATION:  GENERAL:  He is fully conscious, alert, oriented, not in acute distress. VITAL SIGNS:  His blood pressure is 162/106, temperature 97.8. ABDOMEN:  Soft, flat.  Liver, spleen, kidneys not palpable.  I have inserted #24 Foley catheter, but it is not irrigating right because he has blood clots.  I am going to take him to the operating room and evacuate the blood clots under anesthesia.  Also if there is any bleeder, I will probably go ahead and cauterize that, keep him overnight in the hospital.  I am going to go ahead and order CBC and BMET as a routine to make sure that his hematocrit is okay.     Ky Barban, M.D.     MIJ/MEDQ  D:  06/19/2011  T:   06/20/2011  Job:  [id number]

## 2011-06-20 NOTE — Addendum Note (Signed)
Addendum  created 06/20/11 1225 by Glynn Octave, CRNA   Modules edited:Notes Section

## 2011-06-21 ENCOUNTER — Encounter (HOSPITAL_COMMUNITY): Payer: Self-pay | Admitting: Urology

## 2011-06-21 MED ORDER — LISINOPRIL 40 MG PO TABS: 40.0000 mg | ORAL_TABLET | Freq: Every day | ORAL | Status: DC

## 2011-06-21 MED ORDER — OMEGA-3 FATTY ACIDS 1000 MG PO CAPS: 2.0000 g | ORAL_CAPSULE | Freq: Every day | ORAL | Status: DC

## 2011-06-21 MED ORDER — FLAX SEED OIL 1000 MG PO CAPS: 2000.0000 mg | ORAL_CAPSULE | Freq: Every day | ORAL | Status: DC

## 2011-06-21 MED ORDER — HEPARIN SOD (PORK) LOCK FLUSH 100 UNIT/ML IV SOLN: 500.0000 [IU] | INTRAVENOUS | Status: DC | PRN

## 2011-06-21 MED ORDER — ADULT MULTIVITAMIN W/MINERALS CH: 1.0000 | ORAL_TABLET | Freq: Every day | ORAL | Status: DC

## 2011-06-21 MED ORDER — HYDROCHLOROTHIAZIDE 25 MG PO TABS: 25.0000 mg | ORAL_TABLET | Freq: Every day | ORAL | Status: DC

## 2011-06-21 MED ORDER — FENOFIBRATE 145 MG PO TABS: 145.0000 mg | ORAL_TABLET | Freq: Every day | ORAL | Status: DC

## 2011-06-21 MED ORDER — HYDROCODONE-IBUPROFEN 7.5-200 MG PO TABS: 1.0000 | ORAL_TABLET | Freq: Four times a day (QID) | ORAL | Status: DC | PRN

## 2011-06-21 MED ORDER — METOPROLOL SUCCINATE ER 50 MG PO TB24: 50.0000 mg | ORAL_TABLET | Freq: Every day | ORAL | Status: DC

## 2011-06-21 NOTE — Progress Notes (Signed)
Discharge instructions given on medications,and follow up visits,patient verbalized understanding.Family at bedside. No C/O pain or discomfort noted. Accompanied by staff to an awaiting vehicle.

## 2011-06-25 NOTE — ED Provider Notes (Signed)
Medical screening examination/treatment/procedure(s) were performed by non-physician practitioner and as supervising physician I was immediately available for consultation/collaboration.  Nicoletta Dress. Colon Branch, MD 06/25/11 1610

## 2011-06-27 NOTE — Consult Note (Signed)
Report#[id number]

## 2011-06-27 NOTE — H&P (Signed)
Accept consult note as H&P.

## 2011-06-28 NOTE — Consult Note (Signed)
Samuel Long, RUAN NO.:  [id number]  MEDICAL RECORD NO.:  [id number]  LOCATION:  APA08                         FACILITY:  [facility]  PHYSICIAN:  Ky Barban, M.D.DATE OF BIRTH:  1946-04-16  DATE OF CONSULTATION:  06/19/2011 DATE OF DISCHARGE:  06/19/2011                                CONSULTATION   HISTORY OF PRESENT ILLNESS:  This patient who is 66 year old gentleman, a week before, I had done TUR prostate for BPH.  He came with gross total painless hematuria in the emergency room where I am just seeing him and he is found to be in clot retention.  This is his second visit to the emergency room.  He was there during the night.  They put a catheter, irrigated them out, and send him back home.  He came back, his catheter is blocked again.  I cannot get the blood clots out from his bladder.  My feeling is that his bladder was full of blood clots, so I am going to go ahead and take him to the OR, and under anesthesia, get these blood clots out and fulgurated the bleeders.  His past history, family history, personal history are put in his old chart.  REVIEW OF SYSTEMS:  Unremarkable.  PHYSICAL EXAMINATION:  VITAL SIGNS:  Blood pressure is 130/80, temperature is normal. CENTRAL NERVOUS SYSTEM:  No gross neurological deficit. HEAD, NECK, EYE, AND ENT:  Negative. CHEST:  Symmetrical, normal breath sounds. HEART:  Regular sinus rhythm.  No murmur. ABDOMEN:  Soft, flat.  Liver, spleen, kidneys are not palpable. GENITOURINARY:  External genitalia is unremarkable.  Circumcised meatus adequate.  Testicles are normal.  Has Foley catheter in place.  IMPRESSION:  Secondary hemorrhage from transurethral resection of prostate.  PLAN:  Cysto and evacuation and fulguration of the bleeder under anesthesia.  His hematocrit in the emergency room was 47.1.     Ky Barban, M.D.    MIJ/MEDQ  D:  06/27/2011  T:  06/28/2011  Job:  [id number]

## 2011-06-29 NOTE — Discharge Summary (Signed)
Report#[id number]

## 2011-06-30 NOTE — Progress Notes (Signed)
UR Chart Review Completed  

## 2011-06-30 NOTE — Discharge Summary (Signed)
NAME:  Samuel Long, Samuel Long NO.:  [id number]  MEDICAL RECORD NO.:  [id number]  LOCATION:  AP[facility]08                         FACILITY:  [facility]  PHYSICIAN:  Ky Barban, M.D.DATE OF BIRTH:  May 15, 1945  DATE OF ADMISSION:  06/19/2011 DATE OF DISCHARGE:  03/03/2013LH                              DISCHARGE SUMMARY   Mr. Blank is a 66 year old gentleman who is being followed by me for long time, since 2006, after he was referred to me by Dr. Christain Sacramento because of his elevated PSA, and his PSA has been running little bit up and down but he was having some residual urine.  I put him on Flomax and initially there was some improvement in the residual urine but he continued to carry large residual.  He has minimum symptoms of prostatism.  His PSA is 7.3.  His free PSA is 21.2% and at this point, I decided that he should undergo TUR prostate.  He was advised to undergo TUR prostate and he underwent preop admission workup, CBC, BMET.  EKG, chest x-ray was done which was normal.  His past medical history includes passing a kidney stone several years ago, also has hypertension, no diabetes, never had any other surgery.  One of his brother has prostate cancer and after preop workup which was normal, he was taken to the operating room.  TUR prostate was done.  Postop course was benign.  First postop day, his urine is clear, and I am going to discharge him home with Foley catheter.  The patient is up and walking around.  So he is being discharged and will be followed up by me in the office.  He is going home with his Foley catheter.  I have told them to continue his blood pressure medicines and in couple of days when he comes to the office, we will discontinue his Foley catheter.  His final pathology report shows BPH.  FINAL DISCHARGE DIAGNOSIS: 1. Benign prostatic hypertrophy. 2. Hypertension.  DISCHARGE CONDITION:  Improved.  He is advised not to do any heavy physical  work.     Ky Barban, M.D.     MIJ/MEDQ  D:  06/29/2011  T:  06/30/2011  Job:  [id number]

## 2011-07-25 ENCOUNTER — Other Ambulatory Visit (HOSPITAL_COMMUNITY): Payer: Self-pay | Admitting: Family Medicine

## 2011-07-25 DIAGNOSIS — R942 Abnormal results of pulmonary function studies: Secondary | ICD-10-CM

## 2011-07-27 ENCOUNTER — Ambulatory Visit (HOSPITAL_COMMUNITY): Payer: 59

## 2011-08-03 ENCOUNTER — Ambulatory Visit (HOSPITAL_COMMUNITY)
Admission: RE | Admit: 2011-08-03 | Discharge: 2011-08-03 | Disposition: A | Discharge status: Home / Self Care | Payer: 59 | Source: Ambulatory Visit | Attending: Family Medicine | Admitting: Family Medicine

## 2011-08-03 DIAGNOSIS — R059 Cough, unspecified: Secondary | ICD-10-CM | POA: Insufficient documentation

## 2011-08-03 DIAGNOSIS — R05 Cough: Secondary | ICD-10-CM | POA: Insufficient documentation

## 2011-08-03 DIAGNOSIS — R942 Abnormal results of pulmonary function studies: Secondary | ICD-10-CM

## 2011-08-03 DIAGNOSIS — R918 Other nonspecific abnormal finding of lung field: Secondary | ICD-10-CM | POA: Insufficient documentation

## 2011-08-09 ENCOUNTER — Other Ambulatory Visit (HOSPITAL_COMMUNITY): Payer: Self-pay | Admitting: Family Medicine

## 2011-08-09 DIAGNOSIS — D34 Benign neoplasm of thyroid gland: Secondary | ICD-10-CM

## 2011-08-12 ENCOUNTER — Other Ambulatory Visit (HOSPITAL_COMMUNITY): Payer: 59

## 2011-09-19 ENCOUNTER — Ambulatory Visit (HOSPITAL_COMMUNITY): Payer: 59

## 2011-09-23 ENCOUNTER — Ambulatory Visit (HOSPITAL_COMMUNITY)
Admission: RE | Admit: 2011-09-23 | Discharge: 2011-09-23 | Disposition: A | Discharge status: Home / Self Care | Payer: 59 | Source: Ambulatory Visit | Attending: Family Medicine | Admitting: Family Medicine

## 2011-09-23 DIAGNOSIS — D34 Benign neoplasm of thyroid gland: Secondary | ICD-10-CM

## 2011-09-23 DIAGNOSIS — E049 Nontoxic goiter, unspecified: Secondary | ICD-10-CM | POA: Insufficient documentation

## 2011-09-26 ENCOUNTER — Other Ambulatory Visit (HOSPITAL_COMMUNITY): Payer: Self-pay | Admitting: Family Medicine

## 2011-09-26 DIAGNOSIS — E042 Nontoxic multinodular goiter: Secondary | ICD-10-CM

## 2011-10-04 ENCOUNTER — Ambulatory Visit (HOSPITAL_COMMUNITY)
Admission: RE | Admit: 2011-10-04 | Discharge: 2011-10-04 | Disposition: A | Discharge status: Home / Self Care | Payer: 59 | Source: Ambulatory Visit | Attending: Family Medicine | Admitting: Family Medicine

## 2011-10-04 ENCOUNTER — Other Ambulatory Visit (HOSPITAL_COMMUNITY): Payer: Self-pay | Admitting: Family Medicine

## 2011-10-04 VITALS — BP 167/97 | HR 88

## 2011-10-04 DIAGNOSIS — E042 Nontoxic multinodular goiter: Secondary | ICD-10-CM

## 2011-10-04 NOTE — Progress Notes (Signed)
Lidocaine 2%      2.12mL injected

## 2011-10-04 NOTE — Procedures (Signed)
PreOperative Dx: Multiple thyroid nodules Postoperative Dx: Multiple thyroid nodules Procedure:   US guided FNA of thyroid nodule x TWO Radiologist:  Tyron Russell Anesthesia:  2.5 ml of 2% lidocaine Specimen:  FNA of RIGHT nodule x 3, FNA of LEFT nodule x 3 EBL:   None Complications: None

## 2011-12-27 ENCOUNTER — Other Ambulatory Visit (HOSPITAL_COMMUNITY): Payer: Self-pay | Admitting: Family Medicine

## 2011-12-27 DIAGNOSIS — Z0000 Encounter for general adult medical examination without abnormal findings: Secondary | ICD-10-CM

## 2011-12-29 ENCOUNTER — Ambulatory Visit (HOSPITAL_COMMUNITY)
Admission: RE | Admit: 2011-12-29 | Discharge: 2011-12-29 | Disposition: A | Discharge status: Home / Self Care | Payer: Medicare Other | Source: Ambulatory Visit | Attending: Family Medicine | Admitting: Family Medicine

## 2011-12-29 DIAGNOSIS — I714 Abdominal aortic aneurysm, without rupture, unspecified: Secondary | ICD-10-CM | POA: Insufficient documentation

## 2011-12-29 DIAGNOSIS — F17200 Nicotine dependence, unspecified, uncomplicated: Secondary | ICD-10-CM | POA: Insufficient documentation

## 2011-12-29 DIAGNOSIS — Z0000 Encounter for general adult medical examination without abnormal findings: Secondary | ICD-10-CM

## 2011-12-29 DIAGNOSIS — I10 Essential (primary) hypertension: Secondary | ICD-10-CM | POA: Insufficient documentation

## 2012-12-13 ENCOUNTER — Encounter: Payer: Self-pay | Admitting: *Deleted

## 2012-12-14 ENCOUNTER — Ambulatory Visit (INDEPENDENT_AMBULATORY_CARE_PROVIDER_SITE_OTHER): Payer: Medicare Other | Admitting: Cardiology

## 2012-12-14 ENCOUNTER — Encounter: Payer: Self-pay | Admitting: Cardiology

## 2012-12-14 ENCOUNTER — Encounter: Payer: Self-pay | Admitting: *Deleted

## 2012-12-14 VITALS — BP 169/97 | HR 42 | Ht 69.0 in | Wt 174.5 lb

## 2012-12-14 DIAGNOSIS — I712 Thoracic aortic aneurysm, without rupture, unspecified: Secondary | ICD-10-CM | POA: Insufficient documentation

## 2012-12-14 DIAGNOSIS — I517 Cardiomegaly: Secondary | ICD-10-CM | POA: Insufficient documentation

## 2012-12-14 DIAGNOSIS — F1010 Alcohol abuse, uncomplicated: Secondary | ICD-10-CM

## 2012-12-14 DIAGNOSIS — M459 Ankylosing spondylitis of unspecified sites in spine: Secondary | ICD-10-CM | POA: Insufficient documentation

## 2012-12-14 DIAGNOSIS — I10 Essential (primary) hypertension: Secondary | ICD-10-CM

## 2012-12-14 NOTE — Assessment & Plan Note (Signed)
Most likely hypertensive heart disease. An echocardiogram will be obtained to better define.

## 2012-12-14 NOTE — Patient Instructions (Addendum)
Your physician recommends that you schedule a follow-up appointment in: 3 months  Your physician has requested that you have an echocardiogram. Echocardiography is a painless test that uses sound waves to create images of your heart. It provides your doctor with information about the size and shape of your heart and how well your heart's chambers and valves are working. This procedure takes approximately one hour. There are no restrictions for this procedure.WE WILL CALL YOU WITH THE RESULTS  Your physician has requested that you regularly monitor and record your blood pressure readings at home. Please use the same machine at the same time of day to check your readings and record them to bring to your follow-up visit.BRING YOUR RECORDS AT YOUR NEXT OFFICE VISIT  Your physician has recommended you make the following change in your medication:   1) START TAKING POTASSIUM ONCE DAILY 2) INCREASE LISINOPRIL/HCTZ 20/12.5 TWO TABLETS DAILY   Your physician recommends THAT YOU RE-ESTABLISH WITH YOUR PCP Chrissie Noa [facility]   Your physician recommends that you return for lab work in: 2 WEEKS AFTER STARTING THE MEDICATION CHANGES LISTED ABOVE

## 2012-12-14 NOTE — Assessment & Plan Note (Signed)
Expect that this is long-standing, complicated by alcohol abuse and what sounds like occasional medication noncompliance. His current regimen is reasonable. Would increase lisinopril HCTZ to 40 mg/25 mg daily. Add potassium 10 mEq daily. Followup BMET in 2 weeks. Establish followup with Dr. Regino Schultze.

## 2012-12-14 NOTE — Assessment & Plan Note (Signed)
Sounds like this will remain an active struggle for the patient and his wife. I recommended that he seek therapy for this locally, perhaps through behavioral health and other avenues.

## 2012-12-14 NOTE — Assessment & Plan Note (Signed)
Asymptomatic, incidentally noted on chart review of previous chest CT from April 2013. We discussed this today. He will need a followup chest CT this year.

## 2012-12-14 NOTE — Progress Notes (Signed)
Clinical Summary Samuel Long is a 67 y.o.male referred to the office after a recent ER evaluation at [facility] in [place]. Records indicate that he was staying at a facility called [facility] for treatment of alcohol abuse and associated pain medication over use, was noted to have difficult to control blood pressures and was sent for further assessment. Blood pressure recorded in the ER was 165/96.  He is here with his wife today. Has not reestablished primary care with Dr. Regino Schultze in some time. We reviewed his medications. Since being back at home, he states that he started drinking alcohol again, states no more than 4 beers in 24 hours. Reports taking Vicoprofen as directed. States he has a pain management specialist. After speaking with the patient and his wife it was clear that her main concern was the diagnosis of left ventricular hypertrophy. We discussed this today, and also other options  for testing. He denies any chest pain symptoms or breathlessness.  Lab work from August 9 showed sodium 141, potassium 4.0, BUN 12, creatinine 0.6, AST 32 ALT 29, hemoglobin 17.8, platelets 294. Chest x-ray reported left ventricular hypertrophy. ECG was not sent, reportedly showed sinus bradycardia.  ECG today shows sinus bradycardia in the 40s with IVCD/LVH.  No Known Allergies  Current Outpatient Prescriptions  Medication Sig Dispense Refill  . aspirin 325 MG tablet Take 325 mg by mouth daily.      . fish oil-omega-3 fatty acids 1000 MG capsule Take 2 capsules (2 g total) by mouth daily.      . Flaxseed, Linseed, (FLAX SEED OIL) 1000 MG CAPS Take 2 capsules (2,000 mg total) by mouth daily.      Marland Kitchen HYDROcodone-ibuprofen (VICOPROFEN) 7.5-200 MG per tablet Take 1 tablet by mouth every 6 (six) hours as needed. For arthritis pain  30 tablet    . lisinopril-hydrochlorothiazide (PRINZIDE,ZESTORETIC) 20-12.5 MG per tablet Take 2 tablets by mouth daily.      . metoprolol (LOPRESSOR) 50  MG tablet Take 50 mg by mouth 2 (two) times daily.       . Multiple Vitamin (MULITIVITAMIN WITH MINERALS) TABS Take 1 tablet by mouth daily.      . potassium chloride (K-DUR) 10 MEQ tablet Take 10 mEq by mouth daily.      . traZODone (DESYREL) 150 MG tablet Take 150 mg by mouth at bedtime.      Marland Kitchen venlafaxine XR (EFFEXOR-XR) 150 MG 24 hr capsule Take 150 mg by mouth daily.      . vitamin C (ASCORBIC ACID) 500 MG tablet Take 500 mg by mouth daily.       No current facility-administered medications for this visit.    Past Medical History  Diagnosis Date  . Spondylitis, ankylosing   . Essential hypertension, benign   . BPH (benign prostatic hyperplasia)   . Depression   . PTSD (post-traumatic stress disorder)   . Cataracts, bilateral   . Left rib fracture   . Thoracic aortic aneurysm     4.7 x 4.5 cm  April 2013    Past Surgical History  Procedure Laterality Date  . Bone cyst excision      Left foot, [name]  . Transurethral resection of prostate  06/16/2011    Procedure: TRANSURETHRAL RESECTION OF THE PROSTATE (TURP);  Surgeon: Ky Barban, MD;  Location: [facility];  Service: Urology;  Laterality: N/A;    Family History  Problem Relation Age of Onset  . Anesthesia problems Neg Hx   .  Hypotension Neg Hx   . Malignant hyperthermia Neg Hx   . Pseudochol deficiency Neg Hx     Social History Mr. Beichner reports that he has been smoking Cigarettes.  He has a 40 pack-year smoking history. He has never used smokeless tobacco. Mr. Harvel reports that  drinks alcohol.  Review of Systems No palpitations. No syncope. Reports stable appetite, no GI bleeding. Occasional headaches. Chronic back pain. Otherwise negative.  Physical Examination Filed Vitals:   12/14/12 0920  BP: 169/97  Pulse: 42   Filed Weights   12/14/12 0920  Weight: 174 lb 8 oz (79.153 kg)   Appears comfortable at rest. HEENT: Conjunctiva and lids normal, oropharynx clear. Neck: Supple, no elevated JVP or  carotid bruits, no thyromegaly. Lungs: Clear to auscultation, nonlabored breathing at rest. Cardiac: Regular rate and rhythm, soft S4, no S3 or significant systolic murmur, no pericardial rub. Abdomen: Soft, nontender, bowel sounds present, no guarding or rebound. Extremities: No pitting edema, distal pulses 2+. Skin: Warm and dry. Musculoskeletal: No kyphosis. Neuropsychiatric: Alert and oriented x3, affect grossly appropriate.   Problem List and Plan   Essential hypertension, benign Expect that this is long-standing, complicated by alcohol abuse and what sounds like occasional medication noncompliance. His current regimen is reasonable. Would increase lisinopril HCTZ to 40 mg/25 mg daily. Add potassium 10 mEq daily. Followup BMET in 2 weeks. Establish followup with Dr. Regino Schultze.  Left ventricular hypertrophy Most likely hypertensive heart disease. An echocardiogram will be obtained to better define.  Thoracic aortic aneurysm Asymptomatic, incidentally noted on chart review of previous chest CT from April 2013. We discussed this today. He will need a followup chest CT this year.  Alcohol abuse Sounds like this will remain an active struggle for the patient and his wife. I recommended that he seek therapy for this locally, perhaps through behavioral health and other avenues.    Jonelle Sidle, M.D., F.A.C.C.

## 2012-12-27 ENCOUNTER — Other Ambulatory Visit (HOSPITAL_COMMUNITY): Payer: Medicare Other

## 2012-12-28 ENCOUNTER — Ambulatory Visit (HOSPITAL_COMMUNITY)
Admission: RE | Admit: 2012-12-28 | Discharge: 2012-12-28 | Disposition: A | Discharge status: Home / Self Care | Payer: Medicare Other | Source: Ambulatory Visit | Attending: Cardiology | Admitting: Cardiology

## 2012-12-28 DIAGNOSIS — F17200 Nicotine dependence, unspecified, uncomplicated: Secondary | ICD-10-CM | POA: Insufficient documentation

## 2012-12-28 DIAGNOSIS — I517 Cardiomegaly: Secondary | ICD-10-CM

## 2012-12-28 DIAGNOSIS — F1010 Alcohol abuse, uncomplicated: Secondary | ICD-10-CM | POA: Insufficient documentation

## 2012-12-28 DIAGNOSIS — I10 Essential (primary) hypertension: Secondary | ICD-10-CM | POA: Insufficient documentation

## 2012-12-28 NOTE — Progress Notes (Signed)
*  PRELIMINARY RESULTS* Echocardiogram 2D Echocardiogram has been performed.  Jasani Dolney 12/28/2012, 1:51 PM

## 2013-03-19 ENCOUNTER — Ambulatory Visit: Payer: Medicare Other | Admitting: Cardiology

## 2013-03-22 ENCOUNTER — Other Ambulatory Visit (HOSPITAL_COMMUNITY): Payer: Self-pay | Admitting: Family Medicine

## 2013-03-22 DIAGNOSIS — R9389 Abnormal findings on diagnostic imaging of other specified body structures: Secondary | ICD-10-CM

## 2013-03-26 ENCOUNTER — Ambulatory Visit (HOSPITAL_COMMUNITY)
Admission: RE | Admit: 2013-03-26 | Discharge: 2013-03-26 | Disposition: A | Discharge status: Home / Self Care | Payer: Medicare Other | Source: Ambulatory Visit | Attending: Family Medicine | Admitting: Family Medicine

## 2013-03-26 DIAGNOSIS — R9389 Abnormal findings on diagnostic imaging of other specified body structures: Secondary | ICD-10-CM

## 2013-03-26 DIAGNOSIS — I712 Thoracic aortic aneurysm, without rupture, unspecified: Secondary | ICD-10-CM | POA: Insufficient documentation

## 2013-03-26 DIAGNOSIS — R918 Other nonspecific abnormal finding of lung field: Secondary | ICD-10-CM | POA: Insufficient documentation

## 2013-04-05 ENCOUNTER — Encounter: Payer: Medicare Other | Admitting: Cardiothoracic Surgery

## 2013-04-05 ENCOUNTER — Institutional Professional Consult (permissible substitution) (INDEPENDENT_AMBULATORY_CARE_PROVIDER_SITE_OTHER): Payer: Medicare Other | Admitting: Cardiothoracic Surgery

## 2013-04-05 ENCOUNTER — Encounter: Payer: Self-pay | Admitting: Cardiothoracic Surgery

## 2013-04-05 VITALS — BP 150/90 | HR 55 | Resp 20 | Ht 69.0 in | Wt 174.0 lb

## 2013-04-05 DIAGNOSIS — I712 Thoracic aortic aneurysm, without rupture: Secondary | ICD-10-CM

## 2013-04-05 NOTE — Progress Notes (Signed)
PCP is Kirk Ruths, MD Referring Provider is Karleen Hampshire, MD  Chief Complaint  Patient presents with  . Thoracic Aortic Aneurysm    Surgical eval on ascending aortic aneurysm, Chest CT 03/26/13, 2D Echo 12/30/12   2-D echocardiogram and CT scans of the chest reviewed  HPI: 67 year old Caucasian male smoker with history of alcohol abuse, hypertension, ankylosing spondylitis and left inferior hypertrophy by 2-D echocardiogram presents for evaluation of a 4.9 cm fusiform ascending thoracic aortic aneurysm. CT scans since 2012 documented a slight progression in size of this aneurysm from 4.7-4.9 cm. He denies symptoms. There's no family history of aortic dissection. He does not have aortic stenosis on his 2-D echocardiogram. Abdominal ultrasound showed no evidence of AAA. One family member had a thoracic aneurysm which was not treated with surgery.  Patient's risk factors for atherosclerotic basher disease include smoking, hypertension, hyperlipidemia, and alcohol abuse. Patient is currently retired from working as a Chartered certified accountant. He is never had history of MI, CAD, arrhythmia or murmur  He has significant problems with his arthritis he cannot lay flat for one period of time. He is diminished mobility of his extremities as well as difficulty taking deep breaths due to a stiff thorax.  Past Medical History  Diagnosis Date  . Spondylitis, ankylosing   . Essential hypertension, benign   . BPH (benign prostatic hyperplasia)   . Depression   . PTSD (post-traumatic stress disorder)   . Cataracts, bilateral   . Left rib fracture   . Thoracic aortic aneurysm     4.7 x 4.5 cm  April 2013    Past Surgical History  Procedure Laterality Date  . Bone cyst excision      Left foot, [name]  . Transurethral resection of prostate  06/16/2011    Procedure: TRANSURETHRAL RESECTION OF THE PROSTATE (TURP);  Surgeon: Ky Barban, MD;  Location: [facility];  Service: Urology;  Laterality: N/A;     Family History  Problem Relation Age of Onset  . Anesthesia problems Neg Hx   . Hypotension Neg Hx   . Malignant hyperthermia Neg Hx   . Pseudochol deficiency Neg Hx     Social History History  Substance Use Topics  . Smoking status: Current Every Day Smoker -- 1.00 packs/day for 40 years    Types: Cigarettes  . Smokeless tobacco: Never Used  . Alcohol Use: Yes     Comment: 4 beers daily    Current Outpatient Prescriptions  Medication Sig Dispense Refill  . amLODipine (NORVASC) 5 MG tablet Take 5 mg by mouth daily.      Marland Kitchen aspirin 325 MG tablet Take 325 mg by mouth daily.      . fish oil-omega-3 fatty acids 1000 MG capsule Take 2 capsules (2 g total) by mouth daily.      . Flaxseed, Linseed, (FLAX SEED OIL) 1000 MG CAPS Take 2 capsules (2,000 mg total) by mouth daily.      Marland Kitchen gemfibrozil (LOPID) 600 MG tablet Take 600 mg by mouth 2 (two) times daily before a meal.      . HYDROcodone-ibuprofen (VICOPROFEN) 7.5-200 MG per tablet Take 1 tablet by mouth every 6 (six) hours as needed. For arthritis pain  30 tablet    . lisinopril-hydrochlorothiazide (PRINZIDE,ZESTORETIC) 20-12.5 MG per tablet Take 2 tablets by mouth daily.      . metoprolol (LOPRESSOR) 50 MG tablet Take 50 mg by mouth 2 (two) times daily.       . Multiple Vitamin (MULITIVITAMIN WITH  MINERALS) TABS Take 1 tablet by mouth daily.      . potassium chloride (K-DUR) 10 MEQ tablet Take 10 mEq by mouth daily.      . traZODone (DESYREL) 150 MG tablet Take 150 mg by mouth at bedtime.       No current facility-administered medications for this visit.    No Known Allergies  Review of Systems Right-hand dominant  patient takes blood pressure twice a day at home and records usually 140/80-90 He has dental hygiene and cleaning twice a year and denies dental or swallowing complaints   his mobility and exercise tolerance and and limited recently by his arthritis The patient wishes to schedule surgery after August 1 when his  wife will retire from her job working at [employer]  BP 150/90  Pulse 55  Resp 20  Ht 5\' 9"  (1.753 m)  Wt 174 lb (78.926 kg)  BMI 25.68 kg/m2  SpO2 97% Physical Exam Gen. appearance-middle-aged Caucasian male no acute distress accompanied by wife   HEENT normocephalic pupils reactive left pupil slightly larger than right dentition good Neck no JVD mass or bruit Lymphatics no palpable nodes in the cervical region or supral clavicular  Fossa Thorax-scattered rhonchi no deformity or tenderness slight increased AP diameter Cardiac-regular rhythm no murmur or gallop Abdomen-small umbilical hernia, mild ascites, liver not palpable, no pulsatile mass palpable Extremities-mild clubbing without cyanosis edema or tenderness Vascular-good peripheral pulses in all extremities Neurologic-appropriate no focal motor deficit   Diagnostic Tests: Echocardiogram shows LVH, good LV systolic function, some calcification of the aortic arch and aortic root but no significant aortic stenosis   CT chest shows a 4.9 cm fusiform ascending aneurysm up to the arch. The arch and descending thoracic aorta are of normal diameter. No hematoma or penetrating ulcer  Impression: Fusiform ascending aneurysm -serial CT scans show slow progression in size over the past 3 years. It is asymptomatic. Surgical correction has been recommended which would be preceded by catheterization. Patient wishes to delay surgery until August of 2015 because of his wife's work schedule and pending retirement. I quoted the patient is wife a potential risk of dissection of the thoracic aorta at 5% and recommended that he avoid strenuous activity including lifting or yard work until after he recovers from surgery. He is recommended to continue to monitor his blood pressure carefully  and notify his physician if his systolic pressure rises greater than hg . Continue  taking blood pressure medications as  directed.  Instructed the patient to slowly decrease his smoking down to 2-3 cigarettes per day to reduce his risk of pulmonary complications after aneurysm repair. The patient Is also instructed to reduce his alcohol intake which consists of approximately 6 beers per day down to 2 per day    Return in 3 months to schedule cardiac catheterization, assess smoking and alcohol intake, and 2 discuss timing of surgery. Patient understands that if he develops upper back or chest pain he contact his cardiologist or make an appointment with my office immediately.

## 2013-04-15 ENCOUNTER — Telehealth: Payer: Self-pay | Admitting: *Deleted

## 2013-04-15 NOTE — Telephone Encounter (Signed)
Patient called and wanted to let Dr. Donata Clay know that he wants his surgery to be moved up to May 2015 instead of August 2015.  He has an appt w/ Dr. Donata Clay in early March so we will set up the cath and surgery date then.  Patient is aware.

## 2013-06-19 ENCOUNTER — Other Ambulatory Visit: Payer: Self-pay | Admitting: *Deleted

## 2013-06-19 ENCOUNTER — Encounter: Payer: Self-pay | Admitting: Cardiothoracic Surgery

## 2013-06-19 ENCOUNTER — Ambulatory Visit (INDEPENDENT_AMBULATORY_CARE_PROVIDER_SITE_OTHER): Payer: Medicare Other | Admitting: Cardiothoracic Surgery

## 2013-06-19 VITALS — BP 141/84 | HR 55 | Resp 16 | Ht 69.0 in | Wt 172.0 lb

## 2013-06-19 DIAGNOSIS — I712 Thoracic aortic aneurysm, without rupture, unspecified: Secondary | ICD-10-CM

## 2013-06-19 DIAGNOSIS — I7121 Aneurysm of the ascending aorta, without rupture: Secondary | ICD-10-CM

## 2013-06-19 NOTE — Progress Notes (Signed)
PCP is Leonides Grills, MD Referring Provider is Elsie Lincoln, MD  Chief Complaint  Patient presents with  . Thoracic Aortic Aneurysm    3 month f/u to discuss surgery...schedule cardiac cath...assess smoking and alcohol intake    HPI: Followup of thoracic ascending fusiform aneurysm measuring 4.9 cm with recent evidence of growth in diameter. Patient being prepared for surgical repair. He has been trying to cut back on his heavy smoking and is now smoking 3 packs per week. He continues to drink one 32 0z beer daily He has chronic symptomatic arthritis for which he takes narcotics  Past Medical History  Diagnosis Date  . Spondylitis, ankylosing   . Essential hypertension, benign   . BPH (benign prostatic hyperplasia)   . Depression   . PTSD (post-traumatic stress disorder)   . Cataracts, bilateral   . Left rib fracture   . Thoracic aortic aneurysm     4.7 x 4.5 cm  April 2013    Past Surgical History  Procedure Laterality Date  . Bone cyst excision      Left foot, [name]  . Transurethral resection of prostate  06/16/2011    Procedure: TRANSURETHRAL RESECTION OF THE PROSTATE (TURP);  Surgeon: Marissa Nestle, MD;  Location: [facility];  Service: Urology;  Laterality: N/A;    Family History  Problem Relation Age of Onset  . Anesthesia problems Neg Hx   . Hypotension Neg Hx   . Malignant hyperthermia Neg Hx   . Pseudochol deficiency Neg Hx     Social History History  Substance Use Topics  . Smoking status: Current Every Day Smoker -- 1.00 packs/day for 40 years    Types: Cigarettes  . Smokeless tobacco: Never Used  . Alcohol Use: Yes     Comment: 4 beers daily    Current Outpatient Prescriptions  Medication Sig Dispense Refill  . amLODipine (NORVASC) 5 MG tablet Take 5 mg by mouth daily.      Marland Kitchen aspirin 325 MG tablet Take 325 mg by mouth daily.      Marland Kitchen gemfibrozil (LOPID) 600 MG tablet Take 600 mg by mouth 2 (two) times daily before a meal.      .  HYDROcodone-acetaminophen (NORCO) 10-325 MG per tablet Take 1 tablet by mouth every 4 (four) hours as needed.      Marland Kitchen lisinopril-hydrochlorothiazide (PRINZIDE,ZESTORETIC) 20-12.5 MG per tablet Take 2 tablets by mouth daily.      . metoprolol (LOPRESSOR) 50 MG tablet Take 50 mg by mouth 2 (two) times daily.       . Multiple Vitamin (MULITIVITAMIN WITH MINERALS) TABS Take 1 tablet by mouth daily.      . traZODone (DESYREL) 150 MG tablet Take 150 mg by mouth at bedtime.       No current facility-administered medications for this visit.    No Known Allergies  Review of Systems several small less than 4 mm pulmonary nodules on chest CT The liver does not appear fibrotic or cirrhotic on CT scan  BP 141/84  Pulse 55  Resp 16  Ht 5\' 9"  (1.753 m)  Wt 172 lb (78.019 kg)  BMI 25.39 kg/m2  SpO2 98% Physical Exam Alert and comfortable Lungs with scattered rhonchi distant breath sounds AP diameter chest Heart rhythm regular grade 1/6 murmur of aortic sclerosis by echo No edema Diagnostic Tests: None today  Impression: We'll proceed with preop right and left heart catheterization and PFTs Patient understands he needs to reduce cigarette intake to 3 cigarettes daily  before surgery and to sustain that for least 2 weeks to allow airway inflammation to improve  Plan: Return after heart cath to review situation, schedule surgery Patient may need dental evaluation

## 2013-07-10 ENCOUNTER — Encounter: Payer: Self-pay | Admitting: Cardiothoracic Surgery

## 2013-07-10 ENCOUNTER — Ambulatory Visit (HOSPITAL_COMMUNITY)
Admission: RE | Admit: 2013-07-10 | Discharge: 2013-07-10 | Disposition: A | Discharge status: Home / Self Care | Payer: Medicare Other | Source: Ambulatory Visit | Attending: Cardiothoracic Surgery | Admitting: Cardiothoracic Surgery

## 2013-07-10 ENCOUNTER — Ambulatory Visit (INDEPENDENT_AMBULATORY_CARE_PROVIDER_SITE_OTHER): Payer: Medicare Other | Admitting: Cardiothoracic Surgery

## 2013-07-10 ENCOUNTER — Telehealth: Payer: Self-pay

## 2013-07-10 ENCOUNTER — Telehealth: Payer: Self-pay | Admitting: Cardiology

## 2013-07-10 VITALS — BP 113/68 | HR 60 | Resp 16 | Ht 69.0 in | Wt 172.0 lb

## 2013-07-10 DIAGNOSIS — I712 Thoracic aortic aneurysm, without rupture, unspecified: Secondary | ICD-10-CM

## 2013-07-10 DIAGNOSIS — Z01811 Encounter for preprocedural respiratory examination: Secondary | ICD-10-CM | POA: Insufficient documentation

## 2013-07-10 DIAGNOSIS — Z01818 Encounter for other preprocedural examination: Secondary | ICD-10-CM

## 2013-07-10 LAB — PULMONARY FUNCTION TEST
DL/VA % pred: 107 %
DL/VA: 4.61 ml/min/mmHg/L
DLCO cor % pred: 82 %
DLCO cor: 21.65 ml/min/mmHg
DLCO unc % pred: 82 %
DLCO unc: 21.65 ml/min/mmHg
FEF 25-75 Post: 2.04 L/sec
FEF 25-75 Pre: 0.96 L/sec
FEF2575-%Change-Post: 112 %
FEF2575-%Pred-Post: 91 %
FEF2575-%Pred-Pre: 42 %
FEV1-%Change-Post: 15 %
FEV1-%Pred-Post: 70 %
FEV1-%Pred-Pre: 60 %
FEV1-Post: 1.99 L
FEV1-Pre: 1.72 L
FEV1FVC-%Change-Post: 3 %
FEV1FVC-%Pred-Pre: 95 %
FEV6-%Change-Post: 13 %
FEV6-%Pred-Post: 75 %
FEV6-%Pred-Pre: 66 %
FEV6-Post: 2.72 L
FEV6-Pre: 2.4 L
FEV6FVC-%Change-Post: 1 %
FEV6FVC-%Pred-Post: 106 %
FEV6FVC-%Pred-Pre: 104 %
FVC-%Change-Post: 11 %
FVC-%Pred-Post: 71 %
FVC-%Pred-Pre: 63 %
FVC-Post: 2.73 L
FVC-Pre: 2.44 L
Post FEV1/FVC ratio: 73 %
Post FEV6/FVC ratio: 100 %
Pre FEV1/FVC ratio: 70 %
Pre FEV6/FVC Ratio: 98 %
RV % pred: 146 %
RV: 3.12 L
TLC % pred: 94 %
TLC: 5.79 L

## 2013-07-10 MED ORDER — ALBUTEROL SULFATE (2.5 MG/3ML) 0.083% IN NEBU
2.5000 mg | INHALATION_SOLUTION | Freq: Once | RESPIRATORY_TRACT | Status: AC
  Administered 2013-07-10: 2.5 mg via RESPIRATORY_TRACT

## 2013-07-10 NOTE — Progress Notes (Signed)
PCP is Leonides Grills, MD Referring Provider is Elsie Lincoln, MD  Chief Complaint  Patient presents with  . TAA    f/u after HEART CATH and PFT'S    HPI: 5 cm ascending thoracic fusiform aneurysm  Past medical history significant for ankylosing spondylitis, COPD actively smoking and history of alcohol abuse. Since last visit the patient has had no significant chest pain. The patient has reduced his cigarette smoking to 3 cigarettes daily in preparation for surgery. The patient has reduced his alcohol-beer intake to 24 ounce "tall boy" daily. Patient underwent PFTs demonstrating FVC 2.3 and FEV1 1.9 with DLCO 62% of predicted.  The patient will be scheduled for left and right heart catheterization and then return to discuss results and to schedule surgery.   Past Medical History  Diagnosis Date  . Spondylitis, ankylosing   . Essential hypertension, benign   . BPH (benign prostatic hyperplasia)   . Depression   . PTSD (post-traumatic stress disorder)   . Cataracts, bilateral   . Left rib fracture   . Thoracic aortic aneurysm     4.7 x 4.5 cm  April 2013    Past Surgical History  Procedure Laterality Date  . Bone cyst excision      Left foot, [name]  . Transurethral resection of prostate  06/16/2011    Procedure: TRANSURETHRAL RESECTION OF THE PROSTATE (TURP);  Surgeon: Marissa Nestle, MD;  Location: [facility];  Service: Urology;  Laterality: N/A;    Family History  Problem Relation Age of Onset  . Anesthesia problems Neg Hx   . Hypotension Neg Hx   . Malignant hyperthermia Neg Hx   . Pseudochol deficiency Neg Hx     Social History History  Substance Use Topics  . Smoking status: Current Every Day Smoker -- 1.00 packs/day for 40 years    Types: Cigarettes  . Smokeless tobacco: Never Used  . Alcohol Use: Yes     Comment: 4 beers daily    Current Outpatient Prescriptions  Medication Sig Dispense Refill  . amLODipine (NORVASC) 5 MG tablet Take 5 mg by mouth  daily.      Marland Kitchen aspirin 325 MG tablet Take 325 mg by mouth daily.      Marland Kitchen gemfibrozil (LOPID) 600 MG tablet Take 600 mg by mouth 2 (two) times daily before a meal.      . HYDROcodone-acetaminophen (NORCO) 10-325 MG per tablet Take 1 tablet by mouth every 4 (four) hours as needed.      Marland Kitchen lisinopril-hydrochlorothiazide (PRINZIDE,ZESTORETIC) 20-12.5 MG per tablet Take 2 tablets by mouth daily.      . metoprolol (LOPRESSOR) 50 MG tablet Take 50 mg by mouth 2 (two) times daily.       . Multiple Vitamin (MULITIVITAMIN WITH MINERALS) TABS Take 1 tablet by mouth daily.      . traZODone (DESYREL) 150 MG tablet Take 150 mg by mouth at bedtime.       No current facility-administered medications for this visit.    No Known Allergies  Review of Systems no fever no weight loss no significant chest pain Chronic back pain from his ankylosing spondylitis The patient is unable to lay flat for any period of time so left heart catheterization via a radial artery would be preferred  BP 113/68  Pulse 60  Resp 16  Ht 5\' 9"  (1.753 m)  Wt 172 lb (78.019 kg)  BMI 25.39 kg/m2  SpO2 97% Physical Exam Alert and comfortable Breath sounds reduced but clear  Peripheral pulses intact Heart rate rhythm regular without murmur No pedal edema  Diagnostic Tests: PFTs reviewed with patient-adequate for sternotomy and resection of ascending thoracic aneurysm  Impression: Patient congratulated for the reduction of alcohol intake and smoking  Plan: Returned to discuss results of cardiac catheterization and to schedule surgery

## 2013-07-10 NOTE — Telephone Encounter (Signed)
Returned call to patient's wife.Advised to come to office 07/15/13 to have pre cath lab work and pick up instructions for cath.

## 2013-07-10 NOTE — Telephone Encounter (Signed)
Received call from [facility] at [place] office requesting patient to be scheduled for a rt and lf cardiac cath with lf radial access next week for pre op clearance.Cath scheduled with Dr.Jordan 07/17/13 at 10:00 am.Patient called no answer.Left message to call me back for instructions.

## 2013-07-10 NOTE — Telephone Encounter (Signed)
New Message:  Pt states he is returning a call to the nurse about his upcoming procedure.

## 2013-07-11 ENCOUNTER — Telehealth: Payer: Self-pay

## 2013-07-11 NOTE — Telephone Encounter (Signed)
Patient called advised will need to see Dr.before he has cath 07/17/13 for updated H&P.Last office visit with Dr.McDowell

## 2013-07-11 NOTE — Telephone Encounter (Signed)
Last office visit with Dr.McDowell 12/14/12.Appointment scheduled with Jory Sims NP tomorrow in [place] office 07/12/13 3:10 pm.Patient will come to [facility] office Monday 07/15/13 to have pre cath lab and pick up instructions.

## 2013-07-12 ENCOUNTER — Other Ambulatory Visit: Payer: Self-pay | Admitting: Adult Health

## 2013-07-12 ENCOUNTER — Telehealth: Payer: Self-pay

## 2013-07-12 ENCOUNTER — Ambulatory Visit (INDEPENDENT_AMBULATORY_CARE_PROVIDER_SITE_OTHER): Payer: Medicare Other | Admitting: Adult Health

## 2013-07-12 ENCOUNTER — Encounter: Payer: Self-pay | Admitting: Adult Health

## 2013-07-12 ENCOUNTER — Encounter (HOSPITAL_COMMUNITY): Payer: Self-pay | Admitting: Pharmacy Technician

## 2013-07-12 VITALS — BP 132/81 | HR 71 | Ht 69.0 in | Wt 173.0 lb

## 2013-07-12 DIAGNOSIS — Z9889 Other specified postprocedural states: Secondary | ICD-10-CM

## 2013-07-12 DIAGNOSIS — I712 Thoracic aortic aneurysm, without rupture, unspecified: Secondary | ICD-10-CM

## 2013-07-12 DIAGNOSIS — I10 Essential (primary) hypertension: Secondary | ICD-10-CM

## 2013-07-12 NOTE — Assessment & Plan Note (Signed)
Patient pressure is currently well-controlled on amlodipine 5 mg daily. Continue aspirin tablet daily. And lisinopril HCTZ, and metoprolol 50 mg twice a day. He will continue these medications prior cardiac catheterization. Any changes in medication will be completed post procedure.

## 2013-07-12 NOTE — Telephone Encounter (Signed)
Received call from patient's wife advised patient needs to see Jory Sims NP today 07/12/13 in [place] office at 3:10 pm for update H&P.Advised come see me Monday 07/15/13 12:00 noon at [facility] office to pick up cath instructions and have pre cath lab work.

## 2013-07-12 NOTE — Assessment & Plan Note (Signed)
The patient is plan for cardiac catheterization with evaluation of coronary anatomy, and aortic aneurysm, prior to rescue her aneurysm repair. The patient is asymptomatic. I discussed risks benefits and details of the procedure. The patient's main concern is back pain due to his history of ankylosing spondylosis, and inability to lay flat on the cardiac catheter table for any length of time.  I will provide orders for medication, for pain control, to be given a cardiac catheterization. The patient verbalizes understanding of procedure, and is willing to proceed.

## 2013-07-12 NOTE — Progress Notes (Signed)
Name: Samuel Long    DOB: [birth year]  Age: 68 y.o.  MR#: [id number]       PCP:  Leonides Grills, MD      Insurance: Payor: [employer] MEDICARE / Plan: AARP MEDICARE COMPLETE / Product Type: *No Product type* /   CC:    Chief Complaint  Patient presents with  . Cardiomyopathy    ETOH  . Hypertension    VS Filed Vitals:   07/12/13 1507  BP: 132/81  Pulse: 71  Height: 5\' 9"  (1.753 m)  Weight: 173 lb (78.472 kg)    Weights Current Weight  07/12/13 173 lb (78.472 kg)  07/10/13 172 lb (78.019 kg)  06/19/13 172 lb (78.019 kg)    Blood Pressure  BP Readings from Last 3 Encounters:  07/12/13 132/81  07/10/13 113/68  06/19/13 141/84     Admit date:  (Not on file) Last encounter with RMR:  Visit date not found   Allergy Review of patient's allergies indicates no known allergies.  Current Outpatient Prescriptions  Medication Sig Dispense Refill  . amLODipine (NORVASC) 5 MG tablet Take 5 mg by mouth daily.      Marland Kitchen aspirin 325 MG tablet Take 325 mg by mouth daily.      Marland Kitchen gemfibrozil (LOPID) 600 MG tablet Take 600 mg by mouth 2 (two) times daily before a meal.      . HYDROcodone-acetaminophen (NORCO) 10-325 MG per tablet Take 1 tablet by mouth every 4 (four) hours as needed (for pain).       Marland Kitchen lisinopril-hydrochlorothiazide (PRINZIDE,ZESTORETIC) 20-12.5 MG per tablet Take 2 tablets by mouth daily.      . metoprolol (LOPRESSOR) 50 MG tablet Take 50 mg by mouth 2 (two) times daily.       . Multiple Vitamin (MULITIVITAMIN WITH MINERALS) TABS Take 1 tablet by mouth daily.      . traZODone (DESYREL) 150 MG tablet Take 150 mg by mouth at bedtime.       No current facility-administered medications for this visit.    Discontinued Meds:   There are no discontinued medications.  Patient Active Problem List   Diagnosis Date Noted  . Ankylosing spondylitis 12/14/2012  . Left ventricular hypertrophy 12/14/2012  . Essential hypertension, benign 12/14/2012  . Alcohol abuse  12/14/2012  . Thoracic aortic aneurysm 12/14/2012    LABS    Component Value Date/Time   NA 136 06/20/2011 0459   NA 138 06/19/2011 1119   NA 139 06/17/2011 0558   K 3.0* 06/20/2011 0459   K 3.6 06/19/2011 1119   K 3.5 06/17/2011 0558   CL 99 06/20/2011 0459   CL 99 06/19/2011 1119   CL 104 06/17/2011 0558   CO2 29 06/20/2011 0459   CO2 27 06/19/2011 1119   CO2 29 06/17/2011 0558   GLUCOSE 120* 06/20/2011 0459   GLUCOSE 114* 06/19/2011 1119   GLUCOSE 102* 06/17/2011 0558   BUN 6 06/20/2011 0459   BUN 9 06/19/2011 1119   BUN 6 06/17/2011 0558   CREATININE 0.54 06/20/2011 0459   CREATININE 0.59 06/19/2011 1119   CREATININE 0.59 06/17/2011 0558   CALCIUM 9.4 06/20/2011 0459   CALCIUM 10.1 06/19/2011 1119   CALCIUM 9.0 06/17/2011 0558   GFRNONAA >90 06/20/2011 0459   GFRNONAA >90 06/19/2011 1119   GFRNONAA >90 06/17/2011 0558   GFRAA >90 06/20/2011 0459   GFRAA >90 06/19/2011 1119   GFRAA >90 06/17/2011 0558   CMP     Component Value Date/Time  NA 136 06/20/2011 0459   K 3.0* 06/20/2011 0459   CL 99 06/20/2011 0459   CO2 29 06/20/2011 0459   GLUCOSE 120* 06/20/2011 0459   BUN 6 06/20/2011 0459   CREATININE 0.54 06/20/2011 0459   CALCIUM 9.4 06/20/2011 0459   GFRNONAA >90 06/20/2011 0459   GFRAA >90 06/20/2011 0459       Component Value Date/Time   WBC 10.1 06/20/2011 0459   WBC 13.1* 06/19/2011 1119   WBC 8.4 06/17/2011 0558   HGB 13.3 06/20/2011 0459   HGB 16.5 06/19/2011 1119   HGB 13.8 06/17/2011 0558   HCT 38.5* 06/20/2011 0459   HCT 47.1 06/19/2011 1119   HCT 41.3 06/17/2011 0558   MCV 94.6 06/20/2011 0459   MCV 94.8 06/19/2011 1119   MCV 94.7 06/17/2011 0558    Lipid Panel  No results found for this basename: chol, trig, hdl, cholhdl, vldl, ldlcalc    ABG No results found for this basename: phart, pco2, pco2art, po2, po2art, hco3, tco2, acidbasedef, o2sat     No results found for this basename: TSH   BNP (last 3 results) No results found for this basename: PROBNP,  in the last 8760 hours Cardiac Panel (last 3 results) No results found  for this basename: CKTOTAL, CKMB, TROPONINI, RELINDX,  in the last 72 hours  Iron/TIBC/Ferritin No results found for this basename: iron, tibc, ferritin     EKG Orders placed in visit on 12/14/12  . EKG 12-LEAD     Prior Assessment and Plan Problem List as of 07/12/2013     Cardiovascular and Mediastinum   Left ventricular hypertrophy   Last Assessment & Plan   12/14/2012 Office Visit Written 12/14/2012 10:25 AM by Satira Sark, MD     Most likely hypertensive heart disease. An echocardiogram will be obtained to better define.    Essential hypertension, benign   Last Assessment & Plan   12/14/2012 Office Visit Written 12/14/2012 10:24 AM by Satira Sark, MD     Expect that this is long-standing, complicated by alcohol abuse and what sounds like occasional medication noncompliance. His current regimen is reasonable. Would increase lisinopril HCTZ to 40 mg/25 mg daily. Add potassium 10 mEq daily. Followup BMET in 2 weeks. Establish followup with Dr. Orson Ape.    Thoracic aortic aneurysm   Last Assessment & Plan   12/14/2012 Office Visit Written 12/14/2012 10:25 AM by Satira Sark, MD     Asymptomatic, incidentally noted on chart review of previous chest CT from April 2013. We discussed this today. He will need a followup chest CT this year.      Musculoskeletal and Integument   Ankylosing spondylitis     Other   Alcohol abuse   Last Assessment & Plan   12/14/2012 Office Visit Written 12/14/2012 10:26 AM by Satira Sark, MD     Sounds like this will remain an active struggle for the patient and his wife. I recommended that he seek therapy for this locally, perhaps through behavioral health and other avenues.        Imaging: No results found.

## 2013-07-12 NOTE — Progress Notes (Signed)
HPI: Mr. Goldner is a 68 y.o.male with known history of thoracic aneurysm, hypertension, history of alcohol abuse, and tobacco abuse. He comes to the office today for precatheterization evaluation. He is planned for cardiac catheterization 07/17/2013 for evaluation for coronary artery disease prior to thoracic aortic aneurysm repair.   The patient is without complaints of chest pain or dyspnea on exertion. Due to history of ankylosing spondylosis, the patient has chronic pain in his neck and shoulders and lower back. This patient is concerned about having to lie on the cardiac catheterization table to have procedure with issues of chronic pain while lying flat.    Most recent evaluation of thoracic aorta date CT from December 2014 demonstrating aneurysmal dilatation ascending thoracic aorta 4.9 x 4.6 cm.  No Known Allergies  Current Outpatient Prescriptions  Medication Sig Dispense Refill  . amLODipine (NORVASC) 5 MG tablet Take 5 mg by mouth daily.      Marland Kitchen aspirin 325 MG tablet Take 325 mg by mouth daily.      Marland Kitchen gemfibrozil (LOPID) 600 MG tablet Take 600 mg by mouth 2 (two) times daily before a meal.      . HYDROcodone-acetaminophen (NORCO) 10-325 MG per tablet Take 1 tablet by mouth every 4 (four) hours as needed (for pain).       Marland Kitchen lisinopril-hydrochlorothiazide (PRINZIDE,ZESTORETIC) 20-12.5 MG per tablet Take 2 tablets by mouth daily.      . metoprolol (LOPRESSOR) 50 MG tablet Take 50 mg by mouth 2 (two) times daily.       . Multiple Vitamin (MULITIVITAMIN WITH MINERALS) TABS Take 1 tablet by mouth daily.      . traZODone (DESYREL) 150 MG tablet Take 150 mg by mouth at bedtime.       No current facility-administered medications for this visit.    Past Medical History  Diagnosis Date  . Spondylitis, ankylosing   . Essential hypertension, benign   . BPH (benign prostatic hyperplasia)   . Depression   . PTSD (post-traumatic stress disorder)   . Cataracts, bilateral   . Left rib fracture    . Thoracic aortic aneurysm     4.7 x 4.5 cm  April 2013  . AAA (abdominal aortic aneurysm)     Past Surgical History  Procedure Laterality Date  . Bone cyst excision      Left foot, [name]  . Transurethral resection of prostate  06/16/2011    Procedure: TRANSURETHRAL RESECTION OF THE PROSTATE (TURP);  Surgeon: Marissa Nestle, MD;  Location: [facility];  Service: Urology;  Laterality: N/A;    Family History  Problem Relation Age of Onset  . Anesthesia problems Neg Hx   . Hypotension Neg Hx   . Malignant hyperthermia Neg Hx   . Pseudochol deficiency Neg Hx   . Heart failure Mother   . Heart failure Father   . Heart disease Father   . Heart disease Paternal Uncle     Social Hx. Beer daily 1 ppd smoker for 40 yrs Retired Furniture conservator/restorer  ROS: Review of systems complete and found to be negative unless listed above  PHYSICAL EXAM BP 132/81  Pulse 71  Ht 5\' 9"  (1.753 m)  Wt 173 lb (78.472 kg)  BMI 25.54 kg/m2  General: Well developed, well nourished, in no acute distress, ruddy complexion  Head: Eyes PERRLA, No xanthomas.   Normal cephalic and atramatic  Lungs: Clear bilaterally to auscultation and percussion. Heart: HRRR S1 S2, without MRG.  Pulses are 2+ & equal.  No carotid bruit. No JVD.  No abdominal bruits. No femoral bruits. Abdomen: Bowel sounds are positive, abdomen soft and non-tender without masses or                  Hernia's noted. Msk:  Back normal, normal gait. Normal strength and tone for age. Extremities: No clubbing, cyanosis or edema.  DP +1 Neuro: Alert and oriented X 3. Psych:  Good affect, responds appropriately  EKG: NSR with PVC's  ASSESSMENT AND PLAN

## 2013-07-12 NOTE — Progress Notes (Deleted)
    HPI: Samuel Long is a 68 year old patient of Dr. 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 He has a history of hypertension, and bradycardia. He also has a history of a thoracic aortic aneurysm found incidentally on a CT scan in April of 2013. He is followed by Dr. Nils Pyle concerning this. Patient is here on followup.  No Known Allergies  Current Outpatient Prescriptions  Medication Sig Dispense Refill  . amLODipine (NORVASC) 5 MG tablet Take 5 mg by mouth daily.      Marland Kitchen aspirin 325 MG tablet Take 325 mg by mouth daily.      Marland Kitchen gemfibrozil (LOPID) 600 MG tablet Take 600 mg by mouth 2 (two) times daily before a meal.      . HYDROcodone-acetaminophen (NORCO) 10-325 MG per tablet Take 1 tablet by mouth every 4 (four) hours as needed.      Marland Kitchen lisinopril-hydrochlorothiazide (PRINZIDE,ZESTORETIC) 20-12.5 MG per tablet Take 2 tablets by mouth daily.      . metoprolol (LOPRESSOR) 50 MG tablet Take 50 mg by mouth 2 (two) times daily.       . Multiple Vitamin (MULITIVITAMIN WITH MINERALS) TABS Take 1 tablet by mouth daily.      . traZODone (DESYREL) 150 MG tablet Take 150 mg by mouth at bedtime.       No current facility-administered medications for this visit.    Past Medical History  Diagnosis Date  . Spondylitis, ankylosing   . Essential hypertension, benign   . BPH (benign prostatic hyperplasia)   . Depression   . PTSD (post-traumatic stress disorder)   . Cataracts, bilateral   . Left rib fracture   . Thoracic aortic aneurysm     4.7 x 4.5 cm  April 2013    Past Surgical History  Procedure Laterality Date  . Bone cyst excision      Left foot, [name]  . Transurethral resection of prostate  06/16/2011    Procedure: TRANSURETHRAL RESECTION OF THE PROSTATE (TURP);  Surgeon: Marissa Nestle, MD;  Location: [facility];  Service: Urology;  Laterality: N/A;    ROS: PHYSICAL EXAM There were no vitals taken  for this visit.  EKG:  ASSESSMENT AND PLAN

## 2013-07-12 NOTE — Patient Instructions (Signed)
Your physician recommends that you schedule a follow-up appointment in: [facility]

## 2013-07-15 ENCOUNTER — Other Ambulatory Visit (INDEPENDENT_AMBULATORY_CARE_PROVIDER_SITE_OTHER): Payer: Medicare Other

## 2013-07-15 DIAGNOSIS — Z01818 Encounter for other preprocedural examination: Secondary | ICD-10-CM

## 2013-07-15 DIAGNOSIS — I712 Thoracic aortic aneurysm, without rupture, unspecified: Secondary | ICD-10-CM

## 2013-07-15 LAB — BASIC METABOLIC PANEL
BUN: 7 mg/dL (ref 6–23)
CALCIUM: 9.5 mg/dL (ref 8.4–10.5)
CHLORIDE: 94 meq/L — AB (ref 96–112)
CO2: 31 meq/L (ref 19–32)
CREATININE: 0.7 mg/dL (ref 0.4–1.5)
GFR: 125.92 mL/min (ref >60.00)
GLUCOSE: 80 mg/dL (ref 70–99)
Potassium: 4.4 mEq/L (ref 3.5–5.1)
Sodium: 133 mEq/L — ABNORMAL LOW (ref 135–145)

## 2013-07-15 LAB — PROTIME-INR
INR: 1.1 ratio — AB (ref 0.8–1.0)
Prothrombin Time: 11.7 s (ref 10.2–12.4)

## 2013-07-15 NOTE — Addendum Note (Signed)
Addended by: Truett Mainland on: 07/15/2013 09:02 AM   Modules accepted: Orders

## 2013-07-16 LAB — CBC WITH DIFFERENTIAL/PLATELET
BASOS ABS: 0 10*3/uL (ref 0.0–0.1)
BASOS PCT: 0.5 % (ref 0.0–3.0)
EOS ABS: 0.2 10*3/uL (ref 0.0–0.7)
Eosinophils Relative: 2.3 % (ref 0.0–5.0)
HCT: 50.6 % (ref 39.0–52.0)
Hemoglobin: 17.1 g/dL — ABNORMAL HIGH (ref 13.0–17.0)
LYMPHS PCT: 23 % (ref 12.0–46.0)
Lymphs Abs: 2.2 10*3/uL (ref 0.7–4.0)
MCHC: 33.7 g/dL (ref 30.0–36.0)
MCV: 99.6 fl (ref 78.0–100.0)
MONO ABS: 1 10*3/uL (ref 0.1–1.0)
Monocytes Relative: 10.7 % (ref 3.0–12.0)
NEUTROS PCT: 63.5 % (ref 43.0–77.0)
Neutro Abs: 6.1 10*3/uL (ref 1.4–7.7)
Platelets: 312 10*3/uL (ref 150.0–400.0)
RBC: 5.08 Mil/uL (ref 4.22–5.81)
RDW: 13.4 % (ref 11.5–14.6)
WBC: 9.5 10*3/uL (ref 4.5–10.5)

## 2013-07-17 ENCOUNTER — Ambulatory Visit (HOSPITAL_COMMUNITY)
Admission: RE | Admit: 2013-07-17 | Discharge: 2013-07-17 | Disposition: A | Discharge status: Home / Self Care | Payer: Medicare Other | Source: Ambulatory Visit | Attending: Cardiology | Admitting: Cardiology

## 2013-07-17 ENCOUNTER — Encounter (HOSPITAL_COMMUNITY)
Admission: RE | Disposition: A | Discharge status: Home / Self Care | Payer: Self-pay | Source: Ambulatory Visit | Attending: Cardiology

## 2013-07-17 DIAGNOSIS — G8929 Other chronic pain: Secondary | ICD-10-CM | POA: Insufficient documentation

## 2013-07-17 DIAGNOSIS — I714 Abdominal aortic aneurysm, without rupture, unspecified: Secondary | ICD-10-CM | POA: Insufficient documentation

## 2013-07-17 DIAGNOSIS — I10 Essential (primary) hypertension: Secondary | ICD-10-CM | POA: Insufficient documentation

## 2013-07-17 DIAGNOSIS — F4310 Post-traumatic stress disorder, unspecified: Secondary | ICD-10-CM | POA: Insufficient documentation

## 2013-07-17 DIAGNOSIS — I712 Thoracic aortic aneurysm, without rupture, unspecified: Secondary | ICD-10-CM | POA: Insufficient documentation

## 2013-07-17 DIAGNOSIS — F3289 Other specified depressive episodes: Secondary | ICD-10-CM | POA: Insufficient documentation

## 2013-07-17 DIAGNOSIS — I2510 Atherosclerotic heart disease of native coronary artery without angina pectoris: Secondary | ICD-10-CM | POA: Insufficient documentation

## 2013-07-17 DIAGNOSIS — F329 Major depressive disorder, single episode, unspecified: Secondary | ICD-10-CM | POA: Insufficient documentation

## 2013-07-17 DIAGNOSIS — Z9889 Other specified postprocedural states: Secondary | ICD-10-CM

## 2013-07-17 DIAGNOSIS — M459 Ankylosing spondylitis of unspecified sites in spine: Secondary | ICD-10-CM | POA: Insufficient documentation

## 2013-07-17 DIAGNOSIS — F1011 Alcohol abuse, in remission: Secondary | ICD-10-CM | POA: Insufficient documentation

## 2013-07-17 DIAGNOSIS — H269 Unspecified cataract: Secondary | ICD-10-CM | POA: Insufficient documentation

## 2013-07-17 DIAGNOSIS — Z7982 Long term (current) use of aspirin: Secondary | ICD-10-CM | POA: Insufficient documentation

## 2013-07-17 DIAGNOSIS — N400 Enlarged prostate without lower urinary tract symptoms: Secondary | ICD-10-CM | POA: Insufficient documentation

## 2013-07-17 DIAGNOSIS — Z87891 Personal history of nicotine dependence: Secondary | ICD-10-CM | POA: Insufficient documentation

## 2013-07-17 HISTORY — PX: LEFT AND RIGHT HEART CATHETERIZATION WITH CORONARY ANGIOGRAM: SHX5449

## 2013-07-17 LAB — POCT I-STAT 3, ART BLOOD GAS (G3+)
ACID-BASE EXCESS: 1 mmol/L (ref 0.0–2.0)
Acid-base deficit: 6 mmol/L — ABNORMAL HIGH (ref 0.0–2.0)
BICARBONATE: 21.5 meq/L (ref 20.0–24.0)
BICARBONATE: 27.2 meq/L — AB (ref 20.0–24.0)
O2 Saturation: 84 %
O2 Saturation: 86 %
PH ART: 7.256 — AB (ref 7.350–7.450)
PO2 ART: 54 mmHg — AB (ref 80.0–100.0)
TCO2: 23 mmol/L (ref 0–100)
TCO2: 29 mmol/L (ref 0–100)
pCO2 arterial: 47.8 mmHg — ABNORMAL HIGH (ref 35.0–45.0)
pCO2 arterial: 48.3 mmHg — ABNORMAL HIGH (ref 35.0–45.0)
pH, Arterial: 7.364 (ref 7.350–7.450)
pO2, Arterial: 56 mmHg — ABNORMAL LOW (ref 80.0–100.0)

## 2013-07-17 LAB — POCT I-STAT 3, VENOUS BLOOD GAS (G3P V)
Acid-Base Excess: 2 mmol/L (ref 0.0–2.0)
Bicarbonate: 27.5 mEq/L — ABNORMAL HIGH (ref 20.0–24.0)
O2 Saturation: 65 %
PCO2 VEN: 46.1 mmHg (ref 45.0–50.0)
PH VEN: 7.383 — AB (ref 7.250–7.300)
TCO2: 29 mmol/L (ref 0–100)
pO2, Ven: 35 mmHg (ref 30.0–45.0)

## 2013-07-17 LAB — POCT ACTIVATED CLOTTING TIME: Activated Clotting Time: 160 seconds

## 2013-07-17 SURGERY — LEFT AND RIGHT HEART CATHETERIZATION WITH CORONARY ANGIOGRAM
Anesthesia: LOCAL

## 2013-07-17 MED ORDER — HYDROCODONE-ACETAMINOPHEN 10-325 MG PO TABS
1.0000 | ORAL_TABLET | ORAL | Status: DC | PRN
Start: 2013-07-17 — End: 2013-07-17

## 2013-07-17 MED ORDER — SODIUM CHLORIDE 0.9 % IV SOLN
INTRAVENOUS | Status: DC
  Administered 2013-07-17: 08:00:00 via INTRAVENOUS

## 2013-07-17 MED ORDER — FENTANYL CITRATE 0.05 MG/ML IJ SOLN
INTRAMUSCULAR | Status: AC
  Filled 2013-07-17: qty 2

## 2013-07-17 MED ORDER — MIDAZOLAM HCL 2 MG/2ML IJ SOLN
INTRAMUSCULAR | Status: AC
  Filled 2013-07-17: qty 2

## 2013-07-17 MED ORDER — ASPIRIN 81 MG PO CHEW
81.0000 mg | CHEWABLE_TABLET | ORAL | Status: AC
  Administered 2013-07-17: 81 mg via ORAL
  Filled 2013-07-17: qty 1

## 2013-07-17 MED ORDER — HEPARIN (PORCINE) IN NACL 2-0.9 UNIT/ML-% IJ SOLN
INTRAMUSCULAR | Status: AC
  Filled 2013-07-17: qty 1000

## 2013-07-17 MED ORDER — LIDOCAINE HCL (PF) 1 % IJ SOLN
INTRAMUSCULAR | Status: AC
  Filled 2013-07-17: qty 30

## 2013-07-17 MED ORDER — SODIUM CHLORIDE 0.9 % IJ SOLN: 3.0000 mL | INTRAMUSCULAR | Status: DC | PRN

## 2013-07-17 MED ORDER — SODIUM CHLORIDE 0.9 % IJ SOLN: 3.0000 mL | Freq: Two times a day (BID) | INTRAMUSCULAR | Status: DC

## 2013-07-17 MED ORDER — VERAPAMIL HCL 2.5 MG/ML IV SOLN
INTRAVENOUS | Status: AC
  Filled 2013-07-17: qty 2

## 2013-07-17 MED ORDER — SODIUM CHLORIDE 0.9 % IV SOLN
1.0000 mL/kg/h | INTRAVENOUS | Status: DC
  Administered 2013-07-17: 1 mL/kg/h via INTRAVENOUS

## 2013-07-17 MED ORDER — HEPARIN SODIUM (PORCINE) 1000 UNIT/ML IJ SOLN
INTRAMUSCULAR | Status: AC
  Filled 2013-07-17: qty 1

## 2013-07-17 MED ORDER — SODIUM CHLORIDE 0.9 % IV SOLN: 250.0000 mL | INTRAVENOUS | Status: DC | PRN

## 2013-07-17 MED ORDER — DIAZEPAM 5 MG PO TABS
10.0000 mg | ORAL_TABLET | ORAL | Status: AC
  Administered 2013-07-17: 10 mg via ORAL
  Filled 2013-07-17: qty 2

## 2013-07-17 NOTE — H&P (View-Only) (Signed)
HPI: [name] is a 68 y.o.male with known history of thoracic aneurysm, hypertension, history of alcohol abuse, and tobacco abuse. He comes to the office today for precatheterization evaluation. He is planned for cardiac catheterization 07/17/2013 for evaluation for coronary artery disease prior to thoracic aortic aneurysm repair.   The patient is without complaints of chest pain or dyspnea on exertion. Due to history of ankylosing spondylosis, the patient has chronic pain in his neck and shoulders and lower back. This patient is concerned about having to lie on the cardiac catheterization table to have procedure with issues of chronic pain while lying flat.    Most recent evaluation of thoracic aorta date CT from December 2014 demonstrating aneurysmal dilatation ascending thoracic aorta 4.9 x 4.6 cm.  No Known Allergies  Current Outpatient Prescriptions  Medication Sig Dispense Refill  . amLODipine (NORVASC) 5 MG tablet Take 5 mg by mouth daily.      Marland Kitchen aspirin 325 MG tablet Take 325 mg by mouth daily.      Marland Kitchen gemfibrozil (LOPID) 600 MG tablet Take 600 mg by mouth 2 (two) times daily before a meal.      . HYDROcodone-acetaminophen (NORCO) 10-325 MG per tablet Take 1 tablet by mouth every 4 (four) hours as needed (for pain).       Marland Kitchen lisinopril-hydrochlorothiazide (PRINZIDE,ZESTORETIC) 20-12.5 MG per tablet Take 2 tablets by mouth daily.      . metoprolol (LOPRESSOR) 50 MG tablet Take 50 mg by mouth 2 (two) times daily.       . Multiple Vitamin (MULITIVITAMIN WITH MINERALS) TABS Take 1 tablet by mouth daily.      . traZODone (DESYREL) 150 MG tablet Take 150 mg by mouth at bedtime.       No current facility-administered medications for this visit.    Past Medical History  Diagnosis Date  . Spondylitis, ankylosing   . Essential hypertension, benign   . BPH (benign prostatic hyperplasia)   . Depression   . PTSD (post-traumatic stress disorder)   . Cataracts, bilateral   . Left rib fracture    . Thoracic aortic aneurysm     4.7 x 4.5 cm  April 2013  . AAA (abdominal aortic aneurysm)     Past Surgical History  Procedure Laterality Date  . Bone cyst excision      Left foot, [name]  . Transurethral resection of prostate  06/16/2011    Procedure: TRANSURETHRAL RESECTION OF THE PROSTATE (TURP);  Surgeon: Marissa Nestle, MD;  Location: [facility];  Service: Urology;  Laterality: N/A;    Family History  Problem Relation Age of Onset  . Anesthesia problems Neg Hx   . Hypotension Neg Hx   . Malignant hyperthermia Neg Hx   . Pseudochol deficiency Neg Hx   . Heart failure Mother   . Heart failure Father   . Heart disease Father   . Heart disease Paternal Uncle     Social Hx. Beer daily 1 ppd smoker for 40 yrs Retired Furniture conservator/restorer  ROS: Review of systems complete and found to be negative unless listed above  PHYSICAL EXAM BP 132/81  Pulse 71  Ht 5\' 9"  (1.753 m)  Wt 173 lb (78.472 kg)  BMI 25.54 kg/m2  General: Well developed, well nourished, in no acute distress, ruddy complexion  Head: Eyes PERRLA, No xanthomas.   Normal cephalic and atramatic  Lungs: Clear bilaterally to auscultation and percussion. Heart: HRRR S1 S2, without MRG.  Pulses are 2+ & equal.  No carotid bruit. No JVD.  No abdominal bruits. No femoral bruits. Abdomen: Bowel sounds are positive, abdomen soft and non-tender without masses or                  Hernia's noted. Msk:  Back normal, normal gait. Normal strength and tone for age. Extremities: No clubbing, cyanosis or edema.  DP +1 Neuro: Alert and oriented X 3. Psych:  Good affect, responds appropriately  EKG: NSR with PVC's  ASSESSMENT AND PLAN

## 2013-07-17 NOTE — Discharge Instructions (Signed)

## 2013-07-17 NOTE — CV Procedure (Signed)
   Cardiac Catheterization Procedure Note  Name: Samuel Long MRN: [id number] DOB: July 19, 1945  Procedure: Right Heart Cath, Left Heart Cath, Selective Coronary Angiography, LV angiography  Indication: pre op evaluation for thoracic aneurysm repair.   Procedural Details: The right wrist was prepped, draped, and anesthetized with 1% lidocaine. Using the modified Seldinger technique a 5 French sheath was placed in the right radial artery and a 5 French sheath was placed in the right brachial vein. Verapamil 3 mg was given intra-arterially. A Swan-Ganz catheter was used for the right heart catheterization. Standard protocol was followed for recording of right heart pressures and sampling of oxygen saturations. Fick cardiac output was calculated. Standard Judkins catheters were used for selective coronary angiography and left ventriculography. There were no immediate procedural complications. The patient was transferred to the post catheterization recovery area for further monitoring.  Procedural Findings: Hemodynamics RA 5/4 mean 3 mm Hg RV 30/4 mm Hg PA 28/10 mean 17 mm Hg PCWP 10/9 mean 6 mm Hg LV 116/6 mm Hg AO 114/67 mean 88 mm Hg  Oxygen saturations: PA 65% AO 86%  Cardiac Output (Fick) 5.3 L/min  Cardiac Index (Fick) 2.7 L/min/m2   Coronary angiography: Coronary dominance: right  Left mainstem: Normal  Left anterior descending (LAD): mild irregularities in the mid vessel up to 20%.   Left circumflex (LCx): Normal  Right coronary artery (RCA): High takeoff of the PDA. There is a 60% stenosis in the RCA after the takeoff of the PDA.   Left ventriculography: Left ventricular systolic function is normal, LVEF is estimated at 55-65%, there is no significant mitral regurgitation   Aortic root: Enlarged diffusely. No aortic insufficiency.  Final Conclusions:   1. Moderate single vessel CAD involving the PLOM 2. Normal LV function. 3. Ascending thoracic aneurysm without aortic  insufficiency. 4. Normal right heart pressures.  Recommendations: Per Dr. Prescott Gum for aneurysm repair.   [name] [facility] 07/17/2013, 12:14 PM

## 2013-07-17 NOTE — Interval H&P Note (Signed)
History and Physical Interval Note:  07/17/2013 11:16 AM  Devra Dopp  has presented today for surgery, with the diagnosis of Surgical clearance  The various methods of treatment have been discussed with the patient and family. After consideration of risks, benefits and other options for treatment, the patient has consented to  Procedure(s): LEFT AND RIGHT HEART CATHETERIZATION WITH CORONARY ANGIOGRAM (N/A) as a surgical intervention .  The patient's history has been reviewed, patient examined, no change in status, stable for surgery.  I have reviewed the patient's chart and labs.  Questions were answered to the patient's satisfaction.    Cath Lab Visit (complete for each Cath Lab visit)  Clinical Evaluation Leading to the Procedure:   ACS: no  Non-ACS:    Anginal Classification: No Symptoms  Anti-ischemic medical therapy: Maximal Therapy (2 or more classes of medications)  Non-Invasive Test Results: No non-invasive testing performed  Prior CABG: No previous CABG       [name] [facility] 07/17/2013 11:16 AM

## 2013-08-07 ENCOUNTER — Ambulatory Visit (INDEPENDENT_AMBULATORY_CARE_PROVIDER_SITE_OTHER): Payer: Medicare Other | Admitting: Cardiothoracic Surgery

## 2013-08-07 ENCOUNTER — Encounter: Payer: Self-pay | Admitting: Cardiothoracic Surgery

## 2013-08-07 VITALS — BP 112/77 | HR 60 | Resp 20 | Ht 69.0 in | Wt 173.0 lb

## 2013-08-07 DIAGNOSIS — I712 Thoracic aortic aneurysm, without rupture, unspecified: Secondary | ICD-10-CM

## 2013-08-07 DIAGNOSIS — I7121 Aneurysm of the ascending aorta, without rupture: Secondary | ICD-10-CM

## 2013-08-07 DIAGNOSIS — I2510 Atherosclerotic heart disease of native coronary artery without angina pectoris: Secondary | ICD-10-CM

## 2013-08-07 NOTE — Progress Notes (Signed)
PCP is Leonides Grills, MD Referring Provider is Elsie Lincoln, MD  Chief Complaint  Patient presents with  . Thoracic Aortic Aneurysm    Further discuss surgery, Cardiac Cath 07/17/13    HPI: 68 year old Caucasian male nondiabetic smoker with hypertension and a 5 cm fusiform ascending thoracic aneurysm. This has slowly increased in size over the past 3 years. Recent 2-D echocardiogram shows no significant aortic valve disease with good LV function. Recent right and left heart catheterization by Dr. Martinique via right radial artery demonstrates no significant coronary disease with a mild 50% stenosis of the posterior lateral branch right coronary. Right heart catheterization demonstrates normal pressures with cardiac index of 2.2 and mixed venous saturation of 60%. The patient's CT scans previously have been done without contrast. The aortic injection at the time of catheterization does not shows the extent of the aneurysm to the arch and a CT of the thoracic aorta will be  performed prior to surgery.  The patient has a history of significant ankylosing spondylitis with chronic back pain on narcotics. PFTs had been performed however and are adequate for sternotomy-DLCO is 62% of predicted the patient is been a smoker over the years but is currently cut back to 3 cigarettes a day. The patient has had previous alcohol abuse issues with the detox at one point in the remote past but currently is just drinking 2 beers a day for the past several months Past Medical History  Diagnosis Date  . Spondylitis, ankylosing   . Essential hypertension, benign   . BPH (benign prostatic hyperplasia)   . Depression   . PTSD (post-traumatic stress disorder)   . Cataracts, bilateral   . Left rib fracture   . Thoracic aortic aneurysm     4.7 x 4.5 cm  April 2013  . AAA (abdominal aortic aneurysm)     Past Surgical History  Procedure Laterality Date  . Bone cyst excision      Left foot, [name]  .  Transurethral resection of prostate  06/16/2011    Procedure: TRANSURETHRAL RESECTION OF THE PROSTATE (TURP);  Surgeon: Marissa Nestle, MD;  Location: [facility];  Service: Urology;  Laterality: N/A;    Family History  Problem Relation Age of Onset  . Anesthesia problems Neg Hx   . Hypotension Neg Hx   . Malignant hyperthermia Neg Hx   . Pseudochol deficiency Neg Hx   . Heart failure Mother   . Heart failure Father   . Heart disease Father   . Heart disease Paternal Uncle     Social History History  Substance Use Topics  . Smoking status: Current Every Day Smoker -- 1.00 packs/day for 40 years    Types: Cigarettes  . Smokeless tobacco: Never Used  . Alcohol Use: Yes     Comment: 4 beers daily    Current Outpatient Prescriptions  Medication Sig Dispense Refill  . amLODipine (NORVASC) 5 MG tablet Take 5 mg by mouth daily.      Marland Kitchen aspirin 325 MG tablet Take 325 mg by mouth daily.      Marland Kitchen gemfibrozil (LOPID) 600 MG tablet Take 600 mg by mouth 2 (two) times daily before a meal.      . HYDROcodone-acetaminophen (NORCO) 10-325 MG per tablet Take 1 tablet by mouth every 4 (four) hours as needed (for pain).       Marland Kitchen lisinopril-hydrochlorothiazide (PRINZIDE,ZESTORETIC) 20-12.5 MG per tablet Take 2 tablets by mouth daily.      . metoprolol (LOPRESSOR) 50  MG tablet Take 50 mg by mouth 2 (two) times daily.       . Multiple Vitamin (MULITIVITAMIN WITH MINERALS) TABS Take 1 tablet by mouth daily.      . traZODone (DESYREL) 150 MG tablet Take 150 mg by mouth at bedtime.      Marland Kitchen lisinopril (PRINIVIL,ZESTRIL) 40 MG tablet Take 40 mg by mouth daily.        No current facility-administered medications for this visit.    No Known Allergies  Review of Systems  No dental complaints with good dental hygiene No history of TIA or stroke-carotid duplex pending Lives with wife who is retiring from a local community college on May 1 No history of diabetes, DVT or peripheral rash or disease No history  of cirrhosis jaundice or peptic ulcer disease   BP 112/77  Pulse 60  Resp 20  Ht 5\' 9"  (1.753 m)  Wt 173 lb (78.472 kg)  BMI 25.54 kg/m2  SpO2 96% Physical Exam Alert and comfortable accompanied by wife HEENT normocephalic good dentition Neck without JVD mass or bruit Thorax with vertical spine, no deformities otherwise-scattered rhonchi on exam Cardiac regular rhythm without murmur or gallop Abdomen soft nontender without pulsatile mass, liver appropriate size Remedies with mild clubbing no cyanosis edema or tenderness Peripheral pulses intact no venous insufficiency lower extremities Neuro right-hand dominant no focal motor deficit  Diagnostic Tests: Coronary angiogram  reviewed. The mild stenosis of the proximal posterior lateral does not appear to be significant or flow limiting.  Impression: 5 cm fusiform ascending aneurysm slowly growing the past 3 years will be resected and grafted. Aortic valve should be left intact. Plans regarding hypothermic circulatory arrest will be dependent on  the results of CTA of the thoracic aorta which is pending to determine if the proximal arch is involved.  Have discussed the plans for surgery the patient and wife. They understand the indications benefits and alternatives. We discussed the risks of stroke, bleeding, blood transfusion, multisystem failure, and death. He also understands he is at risk for postoperative pulmonary problems due to his heavy smoking history including prolonged intubation. He is willing to use his cigarette intake to 3 daily.  Surgery was scheduled for May 7 at [facility].  Plan: Resection and grafting of ascending fusiform aneurysm at [facility] seventh of May

## 2013-08-08 ENCOUNTER — Other Ambulatory Visit: Payer: Self-pay | Admitting: *Deleted

## 2013-08-08 DIAGNOSIS — I712 Thoracic aortic aneurysm, without rupture, unspecified: Secondary | ICD-10-CM

## 2013-08-13 ENCOUNTER — Encounter (HOSPITAL_COMMUNITY): Payer: Self-pay | Admitting: Pharmacy Technician

## 2013-08-15 ENCOUNTER — Other Ambulatory Visit: Payer: Self-pay | Admitting: *Deleted

## 2013-08-19 ENCOUNTER — Other Ambulatory Visit: Payer: Self-pay | Admitting: *Deleted

## 2013-08-19 DIAGNOSIS — I712 Thoracic aortic aneurysm, without rupture, unspecified: Secondary | ICD-10-CM

## 2013-08-20 ENCOUNTER — Ambulatory Visit (HOSPITAL_COMMUNITY)
Admission: RE | Admit: 2013-08-20 | Discharge: 2013-08-20 | Disposition: A | Discharge status: Home / Self Care | Payer: Medicare Other | Source: Ambulatory Visit | Attending: Cardiothoracic Surgery | Admitting: Cardiothoracic Surgery

## 2013-08-20 ENCOUNTER — Encounter (HOSPITAL_COMMUNITY): Payer: Self-pay

## 2013-08-20 ENCOUNTER — Encounter (HOSPITAL_COMMUNITY)
Admission: RE | Admit: 2013-08-20 | Discharge: 2013-08-20 | Disposition: A | Discharge status: Home / Self Care | Payer: Medicare Other | Source: Ambulatory Visit | Attending: Cardiothoracic Surgery | Admitting: Cardiothoracic Surgery

## 2013-08-20 VITALS — BP 128/84 | HR 56 | Temp 97.8°F | Resp 20 | Ht 69.0 in | Wt 169.6 lb

## 2013-08-20 DIAGNOSIS — I519 Heart disease, unspecified: Secondary | ICD-10-CM

## 2013-08-20 DIAGNOSIS — I712 Thoracic aortic aneurysm, without rupture, unspecified: Secondary | ICD-10-CM

## 2013-08-20 DIAGNOSIS — Z01810 Encounter for preprocedural cardiovascular examination: Secondary | ICD-10-CM

## 2013-08-20 HISTORY — DX: Chronic obstructive pulmonary disease, unspecified: J44.9

## 2013-08-20 HISTORY — DX: Unspecified osteoarthritis, unspecified site: M19.90

## 2013-08-20 LAB — COMPREHENSIVE METABOLIC PANEL
ALT: 11 U/L (ref 0–53)
AST: 18 U/L (ref 0–37)
Albumin: 3.7 g/dL (ref 3.5–5.2)
Alkaline Phosphatase: 81 U/L (ref 39–117)
BUN: 8 mg/dL (ref 6–23)
CO2: 28 mEq/L (ref 19–32)
Calcium: 9.5 mg/dL (ref 8.4–10.5)
Chloride: 98 mEq/L (ref 96–112)
Creatinine, Ser: 0.59 mg/dL (ref 0.50–1.35)
GFR calc Af Amer: >90 mL/min (ref >90)
GFR calc non Af Amer: >90 mL/min (ref >90)
Glucose, Bld: 121 mg/dL — ABNORMAL HIGH (ref 70–99)
Potassium: 4.6 mEq/L (ref 3.7–5.3)
Sodium: 138 mEq/L (ref 137–147)
Total Bilirubin: 0.7 mg/dL (ref 0.3–1.2)
Total Protein: 7.4 g/dL (ref 6.0–8.3)

## 2013-08-20 LAB — PROTIME-INR
INR: 1.01 (ref 0.00–1.49)
Prothrombin Time: 13.1 seconds (ref 11.6–15.2)

## 2013-08-20 LAB — HEMOGLOBIN A1C
Hgb A1c MFr Bld: 5.6 % (ref <5.7)
Mean Plasma Glucose: 114 mg/dL (ref <117)

## 2013-08-20 LAB — BLOOD GAS, ARTERIAL
Acid-base deficit: 0.4 mmol/L (ref 0.0–2.0)
Bicarbonate: 23.2 mEq/L (ref 20.0–24.0)
Drawn by: 205171
FIO2: 0.21 %
O2 Saturation: 96.9 %
Patient temperature: 98.6
TCO2: 24.3 mmol/L (ref 0–100)
pCO2 arterial: 34.9 mmHg — ABNORMAL LOW (ref 35.0–45.0)
pH, Arterial: 7.439 (ref 7.350–7.450)
pO2, Arterial: 84.6 mmHg (ref 80.0–100.0)

## 2013-08-20 LAB — APTT: aPTT: 34 seconds (ref 24–37)

## 2013-08-20 LAB — CBC
HCT: 50.9 % (ref 39.0–52.0)
Hemoglobin: 17.8 g/dL — ABNORMAL HIGH (ref 13.0–17.0)
MCH: 33.9 pg (ref 26.0–34.0)
MCHC: 35 g/dL (ref 30.0–36.0)
MCV: 97 fL (ref 78.0–100.0)
Platelets: 263 10*3/uL (ref 150–400)
RBC: 5.25 MIL/uL (ref 4.22–5.81)
RDW: 13.8 % (ref 11.5–15.5)
WBC: 8.7 10*3/uL (ref 4.0–10.5)

## 2013-08-20 LAB — ABO/RH: ABO/RH(D): AB POS

## 2013-08-20 LAB — URINALYSIS, ROUTINE W REFLEX MICROSCOPIC
Bilirubin Urine: NEGATIVE
Glucose, UA: NEGATIVE mg/dL
Hgb urine dipstick: NEGATIVE
Ketones, ur: NEGATIVE mg/dL
Leukocytes, UA: NEGATIVE
Nitrite: NEGATIVE
Protein, ur: NEGATIVE mg/dL
Specific Gravity, Urine: <1.005 — ABNORMAL LOW (ref 1.005–1.030)
Urobilinogen, UA: 0.2 mg/dL (ref 0.0–1.0)
pH: 6 (ref 5.0–8.0)

## 2013-08-20 LAB — SURGICAL PCR SCREEN
MRSA, PCR: NEGATIVE
Staphylococcus aureus: NEGATIVE

## 2013-08-20 MED ORDER — IOHEXOL 350 MG/ML SOLN
100.0000 mL | Freq: Once | INTRAVENOUS | Status: AC | PRN
  Administered 2013-08-20: 100 mL via INTRAVENOUS

## 2013-08-20 NOTE — Progress Notes (Signed)
Primary physician - dr. Orson Ape - [facility] Cardiologist - [facility] Cath and echo in epic from 2015

## 2013-08-20 NOTE — Progress Notes (Signed)
VASCULAR LAB PRELIMINARY  PRELIMINARY  PRELIMINARY  PRELIMINARY  Pre-op Cardiac Surgery  Carotid Findings:  Bilateral:  1-39% ICA stenosis.  Vertebral artery flow is antegrade.      Upper Extremity Right Left  Brachial Pressures 148 triphasic 147 triphasic  Radial Waveforms triphasic triphasic  Ulnar Waveforms biphasic biphasic  Palmar Arch (Allen's Test) * **   Findings:  *Right:  Doppler waveforms remain normal with ulnar and radial compressions.  **Left:  Doppler waveforms obliterate with radial and remain normal with ulnar compressions.   Charlaine Dalton, RVT 08/20/2013, 3:28 PM

## 2013-08-20 NOTE — Progress Notes (Signed)
08/20/13 1507  OBSTRUCTIVE SLEEP APNEA  Have you ever been diagnosed with sleep apnea through a sleep study? No  Do you snore loudly (loud enough to be heard through closed doors)?  0  Do you often feel tired, fatigued, or sleepy during the daytime? 0  Has anyone observed you stop breathing during your sleep? 0  Do you have, or are you being treated for high blood pressure? 1  BMI more than 35 kg/m2? 0  Age over 67 years old? 1  Neck circumference greater than 40 cm/16 inches? 1  Gender: 1  Obstructive Sleep Apnea Score 4  Score 4 or greater  Results sent to PCP

## 2013-08-20 NOTE — Progress Notes (Signed)
  Echocardiogram 2D Echocardiogram has been performed.  Basilia Jumbo 08/20/2013, 3:01 PM

## 2013-08-20 NOTE — Pre-Procedure Instructions (Signed)
TREVOR WILKIE  08/20/2013   Your procedure is scheduled on:  Thursday, May 7th  Report to [facility] at 0530 AM.  Call this number if you have problems the morning of surgery: [phone]   Remember:   Do not eat food or drink liquids after midnight.   Take these medicines the morning of surgery with A SIP OF WATER: lopressor, norvasc, vicodin if needed   Do not wear jewelry.  Do not wear lotions, powders, or perfumes. You may wear deodorant.  Do not shave 48 hours prior to surgery. Men may shave face and neck.  Do not bring valuables to the hospital.  [facility] is not responsible for any belongings or valuables.               Contacts, dentures or bridgework may not be worn into surgery.  Leave suitcase in the car. After surgery it may be brought to your room.  For patients admitted to the hospital, discharge time is determined by your treatment team.       Please read over the following fact sheets that you were given: Pain Booklet, Coughing and Deep Breathing, Blood Transfusion Information, MRSA Information and Surgical Site Infection Prevention [facility] - Preparing for Surgery  Before surgery, you can play an important role.  Because skin is not sterile, your skin needs to be as free of germs as possible.  You can reduce the number of germs on you skin by washing with CHG (chlorahexidine gluconate) soap before surgery.  CHG is an antiseptic cleaner which kills germs and bonds with the skin to continue killing germs even after washing.  Please DO NOT use if you have an allergy to CHG or antibacterial soaps.  If your skin becomes reddened/irritated stop using the CHG and inform your nurse when you arrive at Short Stay.  Do not shave (including legs and underarms) for at least 48 hours prior to the first CHG shower.  You may shave your face.  Please follow these instructions carefully:   1.  Shower with CHG Soap the night before surgery and the morning of  Surgery.  2.  If you choose to wash your hair, wash your hair first as usual with your normal shampoo.  3.  After you shampoo, rinse your hair and body thoroughly to remove the shampoo.  4.  Use CHG as you would any other liquid soap.  You can apply CHG directly to the skin and wash gently with scrungie or a clean washcloth.  5.  Apply the CHG Soap to your body ONLY FROM THE NECK DOWN.  Do not use on open wounds or open sores.  Avoid contact with your eyes, ears, mouth and genitals (private parts).  Wash genitals (private parts) with your normal soap.  6.  Wash thoroughly, paying special attention to the area where your surgery will be performed.  7.  Thoroughly rinse your body with warm water from the neck down.  8.  DO NOT shower/wash with your normal soap after using and rinsing off the CHG Soap.  9.  Pat yourself dry with a clean towel.            10.  Wear clean pajamas.            11.  Place clean sheets on your bed the night of your first shower and do not sleep with pets.  Day of Surgery  Do not apply any lotions/deoderants the morning of  surgery.  Please wear clean clothes to the hospital/surgery center.

## 2013-08-21 ENCOUNTER — Encounter (HOSPITAL_COMMUNITY): Payer: Self-pay

## 2013-08-21 MED ORDER — DEXTROSE 5 % IV SOLN
1.5000 g | INTRAVENOUS | Status: AC
  Administered 2013-08-22: 1.5 g via INTRAVENOUS
  Administered 2013-08-22: .75 g via INTRAVENOUS
  Filled 2013-08-21: qty 1.5

## 2013-08-21 MED ORDER — MAGNESIUM SULFATE 50 % IJ SOLN
40.0000 meq | INTRAMUSCULAR | Status: DC
  Filled 2013-08-21: qty 10

## 2013-08-21 MED ORDER — PHENYLEPHRINE HCL 10 MG/ML IJ SOLN
30.0000 ug/min | INTRAVENOUS | Status: AC
  Administered 2013-08-22: 25 ug/min via INTRAVENOUS
  Filled 2013-08-21: qty 2

## 2013-08-21 MED ORDER — POTASSIUM CHLORIDE 2 MEQ/ML IV SOLN
80.0000 meq | INTRAVENOUS | Status: DC
  Filled 2013-08-21: qty 40

## 2013-08-21 MED ORDER — PAPAVERINE HCL 30 MG/ML IJ SOLN
INTRAMUSCULAR | Status: AC
  Administered 2013-08-22: 10:00:00
  Filled 2013-08-21: qty 2.5

## 2013-08-21 MED ORDER — SODIUM CHLORIDE 0.9 % IV SOLN
INTRAVENOUS | Status: AC
  Administered 2013-08-22: 13:00:00 via INTRAVENOUS
  Administered 2013-08-22: 69.8 mL/h via INTRAVENOUS
  Filled 2013-08-21: qty 40

## 2013-08-21 MED ORDER — NITROGLYCERIN IN D5W 200-5 MCG/ML-% IV SOLN
2.0000 ug/min | INTRAVENOUS | Status: DC
  Filled 2013-08-21: qty 250

## 2013-08-21 MED ORDER — DEXTROSE 5 % IV SOLN
750.0000 mg | INTRAVENOUS | Status: DC
  Filled 2013-08-21: qty 750

## 2013-08-21 MED ORDER — DEXMEDETOMIDINE HCL IN NACL 400 MCG/100ML IV SOLN
0.1000 ug/kg/h | INTRAVENOUS | Status: AC
  Administered 2013-08-22: 16:00:00 via INTRAVENOUS
  Administered 2013-08-22: 0.3 ug/kg/h via INTRAVENOUS
  Filled 2013-08-21: qty 100

## 2013-08-21 MED ORDER — CHLORHEXIDINE GLUCONATE 4 % EX LIQD
30.0000 mL | CUTANEOUS | Status: DC
  Filled 2013-08-21: qty 30

## 2013-08-21 MED ORDER — SODIUM CHLORIDE 0.9 % IV SOLN
INTRAVENOUS | Status: AC
  Administered 2013-08-22: .8 [IU]/h via INTRAVENOUS
  Filled 2013-08-21: qty 1

## 2013-08-21 MED ORDER — EPINEPHRINE HCL 1 MG/ML IJ SOLN
0.5000 ug/min | INTRAVENOUS | Status: DC
  Filled 2013-08-21: qty 4

## 2013-08-21 MED ORDER — DOPAMINE-DEXTROSE 3.2-5 MG/ML-% IV SOLN
2.0000 ug/kg/min | INTRAVENOUS | Status: AC
  Administered 2013-08-22: 2.5 ug/kg/min via INTRAVENOUS
  Filled 2013-08-21: qty 250

## 2013-08-21 MED ORDER — SODIUM CHLORIDE 0.9 % IV SOLN
INTRAVENOUS | Status: DC
  Filled 2013-08-21: qty 30

## 2013-08-21 MED ORDER — METOPROLOL TARTRATE 12.5 MG HALF TABLET: 12.5000 mg | ORAL_TABLET | Freq: Once | ORAL | Status: DC

## 2013-08-21 MED ORDER — VANCOMYCIN HCL 10 G IV SOLR
1250.0000 mg | INTRAVENOUS | Status: AC
  Administered 2013-08-22: 1250 mg via INTRAVENOUS
  Filled 2013-08-21: qty 1250

## 2013-08-21 NOTE — Progress Notes (Signed)
Anesthesia Chart Review:  Patient is a 68 year old male scheduled for replacement of ascending aorta and possible CABG on 08/22/13 by Dr. Prescott Gum. TAA measured 4.8 cm by 08/20/13 CTA.  History includes smoking, HTN, PTSD, COPD, ankylosing spondylitis, BPH s/p TURP '13 (done under spinal). Dr. Everrett Coombe notes also mention a history of ETOH abuse with a history of detox in the past and is now drinking "2 beers a day." PCP is Dr. Elsie Lincoln.    Cardiac cath was done by Dr. Peter Martinique on 07/17/13 and showed:  Coronary dominance: right. Left mainstem: Normal Left anterior descending (LAD): mild irregularities in the mid vessel up to 20%. Left circumflex (LCx): Normal. Right coronary artery (RCA): High takeoff of the PDA. There is a 60% stenosis in the RCA after the takeoff of the PDA. Left ventriculography: Left ventricular systolic function is normal, LVEF is estimated at 55-65%, there is no significant mitral regurgitation. Aortic root: Enlarged diffusely. No aortic insufficiency.  Final Conclusions:  1. Moderate single vessel CAD involving the PLOM. 2. Normal LV function.  3. Ascending thoracic aneurysm without aortic insufficiency.  4. Normal right heart pressures.  Echo on 08/20/13 showed: - Left ventricle: The cavity size was normal. Wall thickness was normal. Systolic function was normal. The estimated ejection fraction was in the range of 60% to 65%. Wall motion was normal; there were no regional wall motion abnormalities. Doppler parameters are consistent with abnormal left ventricular relaxation (grade 1 diastolic dysfunction). - Aortic valve: Trivial regurgitation. Valve area: 2.3cm^2(VTI). Valve area: 2.34cm^2 (Vmax). - Atrial septum: No defect or patent foramen ovale was identified.  Preoperative EKG, CXR, PFTs, and labs noted.   Anticipate that he can proceed as planned.  No reported history of difficult airway, but with known ankylosing spondylitis this may be the case.  Further  evaluation by his assigned anesthesiologist on the day of surgery.  George Hugh [facility] Short Stay Center/Anesthesiology Phone [phone] 08/21/2013 9:37 AM

## 2013-08-22 ENCOUNTER — Encounter (HOSPITAL_COMMUNITY): Payer: Medicare Other | Admitting: Anesthesiology

## 2013-08-22 ENCOUNTER — Encounter (HOSPITAL_COMMUNITY): Payer: Self-pay | Admitting: *Deleted

## 2013-08-22 ENCOUNTER — Inpatient Hospital Stay (HOSPITAL_COMMUNITY): Payer: Medicare Other

## 2013-08-22 ENCOUNTER — Encounter (HOSPITAL_COMMUNITY)
Admission: RE | Disposition: A | Discharge status: Home / Self Care | Payer: Medicare Other | Source: Ambulatory Visit | Attending: Cardiothoracic Surgery

## 2013-08-22 ENCOUNTER — Inpatient Hospital Stay (HOSPITAL_COMMUNITY)
Admission: RE | Admit: 2013-08-22 | Discharge: 2013-08-28 | DRG: 220 | Disposition: A | Discharge status: Home / Self Care | Payer: Medicare Other | Source: Ambulatory Visit | Attending: Cardiothoracic Surgery | Admitting: Cardiothoracic Surgery

## 2013-08-22 ENCOUNTER — Inpatient Hospital Stay (HOSPITAL_COMMUNITY): Payer: Medicare Other | Admitting: Anesthesiology

## 2013-08-22 DIAGNOSIS — F1010 Alcohol abuse, uncomplicated: Secondary | ICD-10-CM | POA: Diagnosis present

## 2013-08-22 DIAGNOSIS — M4690 Unspecified inflammatory spondylopathy, site unspecified: Secondary | ICD-10-CM | POA: Diagnosis present

## 2013-08-22 DIAGNOSIS — D62 Acute posthemorrhagic anemia: Secondary | ICD-10-CM | POA: Diagnosis not present

## 2013-08-22 DIAGNOSIS — J449 Chronic obstructive pulmonary disease, unspecified: Secondary | ICD-10-CM | POA: Diagnosis present

## 2013-08-22 DIAGNOSIS — N400 Enlarged prostate without lower urinary tract symptoms: Secondary | ICD-10-CM | POA: Diagnosis present

## 2013-08-22 DIAGNOSIS — Z79899 Other long term (current) drug therapy: Secondary | ICD-10-CM

## 2013-08-22 DIAGNOSIS — I712 Thoracic aortic aneurysm, without rupture, unspecified: Secondary | ICD-10-CM | POA: Diagnosis present

## 2013-08-22 DIAGNOSIS — I714 Abdominal aortic aneurysm, without rupture, unspecified: Secondary | ICD-10-CM

## 2013-08-22 DIAGNOSIS — F329 Major depressive disorder, single episode, unspecified: Secondary | ICD-10-CM | POA: Diagnosis present

## 2013-08-22 DIAGNOSIS — I2510 Atherosclerotic heart disease of native coronary artery without angina pectoris: Secondary | ICD-10-CM | POA: Diagnosis present

## 2013-08-22 DIAGNOSIS — I739 Peripheral vascular disease, unspecified: Secondary | ICD-10-CM | POA: Diagnosis present

## 2013-08-22 DIAGNOSIS — F3289 Other specified depressive episodes: Secondary | ICD-10-CM | POA: Diagnosis present

## 2013-08-22 DIAGNOSIS — E8779 Other fluid overload: Secondary | ICD-10-CM | POA: Diagnosis present

## 2013-08-22 DIAGNOSIS — F17200 Nicotine dependence, unspecified, uncomplicated: Secondary | ICD-10-CM | POA: Diagnosis present

## 2013-08-22 DIAGNOSIS — F4310 Post-traumatic stress disorder, unspecified: Secondary | ICD-10-CM | POA: Diagnosis present

## 2013-08-22 DIAGNOSIS — E119 Type 2 diabetes mellitus without complications: Secondary | ICD-10-CM | POA: Diagnosis present

## 2013-08-22 DIAGNOSIS — I10 Essential (primary) hypertension: Secondary | ICD-10-CM | POA: Diagnosis present

## 2013-08-22 DIAGNOSIS — M129 Arthropathy, unspecified: Secondary | ICD-10-CM | POA: Diagnosis present

## 2013-08-22 DIAGNOSIS — J4489 Other specified chronic obstructive pulmonary disease: Secondary | ICD-10-CM | POA: Diagnosis present

## 2013-08-22 HISTORY — PX: INTRAOPERATIVE TRANSESOPHAGEAL ECHOCARDIOGRAM: SHX5062

## 2013-08-22 HISTORY — PX: REPLACEMENT ASCENDING AORTA: SHX6068

## 2013-08-22 HISTORY — PX: CORONARY ARTERY BYPASS GRAFT: SHX141

## 2013-08-22 LAB — CBC
HEMATOCRIT: 39.2 % (ref 39.0–52.0)
HEMATOCRIT: 32.7 % — AB (ref 39.0–52.0)
Hemoglobin: 13.5 g/dL (ref 13.0–17.0)
Hemoglobin: 11.2 g/dL — ABNORMAL LOW (ref 13.0–17.0)
MCH: 33.4 pg (ref 26.0–34.0)
MCH: 32.7 pg (ref 26.0–34.0)
MCHC: 34.4 g/dL (ref 30.0–36.0)
MCHC: 34.3 g/dL (ref 30.0–36.0)
MCV: 97 fL (ref 78.0–100.0)
MCV: 95.6 fL (ref 78.0–100.0)
Platelets: 185 10*3/uL (ref 150–400)
Platelets: 205 10*3/uL (ref 150–400)
RBC: 4.04 MIL/uL — ABNORMAL LOW (ref 4.22–5.81)
RBC: 3.42 MIL/uL — ABNORMAL LOW (ref 4.22–5.81)
RDW: 13.9 % (ref 11.5–15.5)
RDW: 13.9 % (ref 11.5–15.5)
WBC: 9.8 10*3/uL (ref 4.0–10.5)
WBC: 10.7 10*3/uL — AB (ref 4.0–10.5)

## 2013-08-22 LAB — POCT I-STAT 4, (NA,K, GLUC, HGB,HCT)
Glucose, Bld: 88 mg/dL (ref 70–99)
Glucose, Bld: 93 mg/dL (ref 70–99)
Glucose, Bld: 72 mg/dL (ref 70–99)
Glucose, Bld: 83 mg/dL (ref 70–99)
Glucose, Bld: 119 mg/dL — ABNORMAL HIGH (ref 70–99)
Glucose, Bld: 152 mg/dL — ABNORMAL HIGH (ref 70–99)
HCT: 43 % (ref 39.0–52.0)
HCT: 41 % (ref 39.0–52.0)
HCT: 27 % — ABNORMAL LOW (ref 39.0–52.0)
HCT: 33 % — ABNORMAL LOW (ref 39.0–52.0)
HCT: 28 % — ABNORMAL LOW (ref 39.0–52.0)
HCT: 41 % (ref 39.0–52.0)
Hemoglobin: 14.6 g/dL (ref 13.0–17.0)
Hemoglobin: 13.9 g/dL (ref 13.0–17.0)
Hemoglobin: 9.2 g/dL — ABNORMAL LOW (ref 13.0–17.0)
Hemoglobin: 11.2 g/dL — ABNORMAL LOW (ref 13.0–17.0)
Hemoglobin: 9.5 g/dL — ABNORMAL LOW (ref 13.0–17.0)
Hemoglobin: 13.9 g/dL (ref 13.0–17.0)
Potassium: 3.9 mEq/L (ref 3.7–5.3)
Potassium: 3.8 mEq/L (ref 3.7–5.3)
Potassium: 4.5 mEq/L (ref 3.7–5.3)
Potassium: 5 mEq/L (ref 3.7–5.3)
Potassium: 4.5 mEq/L (ref 3.7–5.3)
Potassium: 3.4 mEq/L — ABNORMAL LOW (ref 3.7–5.3)
Sodium: 139 mEq/L (ref 137–147)
Sodium: 141 mEq/L (ref 137–147)
Sodium: 137 mEq/L (ref 137–147)
Sodium: 134 mEq/L — ABNORMAL LOW (ref 137–147)
Sodium: 139 mEq/L (ref 137–147)
Sodium: 141 mEq/L (ref 137–147)

## 2013-08-22 LAB — POCT I-STAT 3, ART BLOOD GAS (G3+)
Acid-Base Excess: 1 mmol/L (ref 0.0–2.0)
Bicarbonate: 25.7 mEq/L — ABNORMAL HIGH (ref 20.0–24.0)
Bicarbonate: 25.2 mEq/L — ABNORMAL HIGH (ref 20.0–24.0)
Bicarbonate: 25.1 mEq/L — ABNORMAL HIGH (ref 20.0–24.0)
O2 Saturation: 100 %
O2 Saturation: 99 %
O2 Saturation: 94 %
Patient temperature: 36.1
TCO2: 27 mmol/L (ref 0–100)
TCO2: 26 mmol/L (ref 0–100)
TCO2: 26 mmol/L (ref 0–100)
pCO2 arterial: 41.8 mmHg (ref 35.0–45.0)
pCO2 arterial: 41.8 mmHg (ref 35.0–45.0)
pCO2 arterial: 39.5 mmHg (ref 35.0–45.0)
pH, Arterial: 7.397 (ref 7.350–7.450)
pH, Arterial: 7.389 (ref 7.350–7.450)
pH, Arterial: 7.406 (ref 7.350–7.450)
pO2, Arterial: 398 mmHg — ABNORMAL HIGH (ref 80.0–100.0)
pO2, Arterial: 147 mmHg — ABNORMAL HIGH (ref 80.0–100.0)
pO2, Arterial: 69 mmHg — ABNORMAL LOW (ref 80.0–100.0)

## 2013-08-22 LAB — MAGNESIUM: Magnesium: 2.6 mg/dL — ABNORMAL HIGH (ref 1.5–2.5)

## 2013-08-22 LAB — POCT I-STAT GLUCOSE
Glucose, Bld: 92 mg/dL (ref 70–99)
Glucose, Bld: 82 mg/dL (ref 70–99)
Glucose, Bld: 99 mg/dL (ref 70–99)
Operator id: 282221
Operator id: 3406
Operator id: 3406

## 2013-08-22 LAB — POCT I-STAT, CHEM 8
BUN: 6 mg/dL (ref 6–23)
Calcium, Ion: 1.11 mmol/L — ABNORMAL LOW (ref 1.13–1.30)
Chloride: 107 mEq/L (ref 96–112)
Creatinine, Ser: 0.5 mg/dL (ref 0.50–1.35)
Glucose, Bld: 130 mg/dL — ABNORMAL HIGH (ref 70–99)
HCT: 32 % — ABNORMAL LOW (ref 39.0–52.0)
Hemoglobin: 10.9 g/dL — ABNORMAL LOW (ref 13.0–17.0)
Potassium: 3.5 mEq/L — ABNORMAL LOW (ref 3.7–5.3)
Sodium: 146 mEq/L (ref 137–147)
TCO2: 24 mmol/L (ref 0–100)

## 2013-08-22 LAB — APTT: aPTT: 66 seconds — ABNORMAL HIGH (ref 24–37)

## 2013-08-22 LAB — FIBRINOGEN: Fibrinogen: 238 mg/dL (ref 204–475)

## 2013-08-22 LAB — PROTIME-INR
INR: 1.9 — AB (ref 0.00–1.49)
Prothrombin Time: 21.2 seconds — ABNORMAL HIGH (ref 11.6–15.2)

## 2013-08-22 LAB — HEMOGLOBIN AND HEMATOCRIT, BLOOD
HCT: 34.2 % — ABNORMAL LOW (ref 39.0–52.0)
Hemoglobin: 11.6 g/dL — ABNORMAL LOW (ref 13.0–17.0)

## 2013-08-22 LAB — PLATELET COUNT: Platelets: 63 10*3/uL — ABNORMAL LOW (ref 150–400)

## 2013-08-22 LAB — CREATININE, SERUM
Creatinine, Ser: 0.43 mg/dL — ABNORMAL LOW (ref 0.50–1.35)
GFR calc Af Amer: >90 mL/min (ref >90)

## 2013-08-22 SURGERY — REPLACEMENT, AORTA, ASCENDING
Anesthesia: General | Site: Chest

## 2013-08-22 MED ORDER — ARTIFICIAL TEARS OP OINT
TOPICAL_OINTMENT | OPHTHALMIC | Status: DC | PRN
  Administered 2013-08-22: 1 via OPHTHALMIC

## 2013-08-22 MED ORDER — FENTANYL CITRATE 0.05 MG/ML IJ SOLN
INTRAMUSCULAR | Status: AC
  Filled 2013-08-22: qty 5

## 2013-08-22 MED ORDER — PROPOFOL 10 MG/ML IV BOLUS
INTRAVENOUS | Status: AC
  Filled 2013-08-22: qty 20

## 2013-08-22 MED ORDER — LIDOCAINE HCL (CARDIAC) 20 MG/ML IV SOLN
INTRAVENOUS | Status: AC
  Filled 2013-08-22: qty 5

## 2013-08-22 MED ORDER — ASPIRIN 81 MG PO CHEW: 324.0000 mg | CHEWABLE_TABLET | Freq: Every day | ORAL | Status: DC

## 2013-08-22 MED ORDER — ACETAMINOPHEN 160 MG/5ML PO SOLN: 650.0000 mg | Freq: Once | ORAL | Status: AC

## 2013-08-22 MED ORDER — ROCURONIUM BROMIDE 50 MG/5ML IV SOLN
INTRAVENOUS | Status: AC
  Filled 2013-08-22: qty 1

## 2013-08-22 MED ORDER — SODIUM CHLORIDE 0.9 % IV SOLN: 250.0000 mL | INTRAVENOUS | Status: DC

## 2013-08-22 MED ORDER — SODIUM CHLORIDE 0.9 % IJ SOLN
3.0000 mL | Freq: Two times a day (BID) | INTRAMUSCULAR | Status: DC
  Administered 2013-08-23 – 2013-08-24 (×3): 3 mL via INTRAVENOUS
  Administered 2013-08-25: 6 mL via INTRAVENOUS

## 2013-08-22 MED ORDER — SODIUM CHLORIDE 0.9 % IJ SOLN
INTRAMUSCULAR | Status: AC
  Filled 2013-08-22: qty 10

## 2013-08-22 MED ORDER — PROTAMINE SULFATE 10 MG/ML IV SOLN
INTRAVENOUS | Status: AC
  Filled 2013-08-22: qty 5

## 2013-08-22 MED ORDER — PROTAMINE SULFATE 10 MG/ML IV SOLN
INTRAVENOUS | Status: DC | PRN
  Administered 2013-08-22 (×5): 50 mg via INTRAVENOUS

## 2013-08-22 MED ORDER — SODIUM CHLORIDE 0.45 % IV SOLN
INTRAVENOUS | Status: DC
  Administered 2013-08-22: 17:00:00 via INTRAVENOUS

## 2013-08-22 MED ORDER — VANCOMYCIN HCL IN DEXTROSE 1-5 GM/200ML-% IV SOLN
1000.0000 mg | Freq: Once | INTRAVENOUS | Status: DC
  Filled 2013-08-22: qty 200

## 2013-08-22 MED ORDER — PANTOPRAZOLE SODIUM 40 MG PO TBEC
40.0000 mg | DELAYED_RELEASE_TABLET | Freq: Every day | ORAL | Status: DC
  Administered 2013-08-24 – 2013-08-28 (×5): 40 mg via ORAL
  Filled 2013-08-22 (×5): qty 1

## 2013-08-22 MED ORDER — PROPOFOL 10 MG/ML IV BOLUS
INTRAVENOUS | Status: DC | PRN
  Administered 2013-08-22: 80 mg via INTRAVENOUS
  Administered 2013-08-22: 200 mg via INTRAVENOUS

## 2013-08-22 MED ORDER — POTASSIUM CHLORIDE 10 MEQ/50ML IV SOLN
10.0000 meq | INTRAVENOUS | Status: AC
  Administered 2013-08-22 – 2013-08-23 (×3): 10 meq via INTRAVENOUS

## 2013-08-22 MED ORDER — LACTATED RINGERS IV SOLN: 500.0000 mL | Freq: Once | INTRAVENOUS | Status: AC | PRN

## 2013-08-22 MED ORDER — VECURONIUM BROMIDE 10 MG IV SOLR
INTRAVENOUS | Status: AC
  Filled 2013-08-22: qty 10

## 2013-08-22 MED ORDER — BISACODYL 10 MG RE SUPP: 10.0000 mg | Freq: Every day | RECTAL | Status: DC

## 2013-08-22 MED ORDER — HEMOSTATIC AGENTS (NO CHARGE) OPTIME
TOPICAL | Status: DC | PRN
  Administered 2013-08-22 (×4): 1 via TOPICAL

## 2013-08-22 MED ORDER — MIDAZOLAM HCL 10 MG/2ML IJ SOLN
INTRAMUSCULAR | Status: AC
  Filled 2013-08-22: qty 2

## 2013-08-22 MED ORDER — INSULIN REGULAR BOLUS VIA INFUSION
0.0000 [IU] | Freq: Three times a day (TID) | INTRAVENOUS | Status: DC
  Filled 2013-08-22: qty 10

## 2013-08-22 MED ORDER — ROCURONIUM BROMIDE 100 MG/10ML IV SOLN
INTRAVENOUS | Status: DC | PRN
  Administered 2013-08-22: 50 mg via INTRAVENOUS

## 2013-08-22 MED ORDER — MIDAZOLAM HCL 5 MG/5ML IJ SOLN
INTRAMUSCULAR | Status: DC | PRN
  Administered 2013-08-22: 3 mg via INTRAVENOUS
  Administered 2013-08-22: 2 mg via INTRAVENOUS
  Administered 2013-08-22: 1 mg via INTRAVENOUS
  Administered 2013-08-22: 2 mg via INTRAVENOUS
  Administered 2013-08-22: 3 mg via INTRAVENOUS
  Administered 2013-08-22: 2 mg via INTRAVENOUS
  Administered 2013-08-22: 1 mg via INTRAVENOUS
  Administered 2013-08-22 (×3): 2 mg via INTRAVENOUS

## 2013-08-22 MED ORDER — CALCIUM CHLORIDE 10 % IV SOLN
1.0000 g | Freq: Once | INTRAVENOUS | Status: AC
  Administered 2013-08-22: 1 g via INTRAVENOUS

## 2013-08-22 MED ORDER — POTASSIUM CHLORIDE 10 MEQ/50ML IV SOLN
10.0000 meq | INTRAVENOUS | Status: AC
  Administered 2013-08-22 (×3): 10 meq via INTRAVENOUS

## 2013-08-22 MED ORDER — NITROGLYCERIN IN D5W 200-5 MCG/ML-% IV SOLN: 0.0000 ug/min | INTRAVENOUS | Status: DC

## 2013-08-22 MED ORDER — DOPAMINE-DEXTROSE 3.2-5 MG/ML-% IV SOLN: 2.0000 ug/kg/min | INTRAVENOUS | Status: DC

## 2013-08-22 MED ORDER — ONDANSETRON HCL 4 MG/2ML IJ SOLN: 4.0000 mg | Freq: Four times a day (QID) | INTRAMUSCULAR | Status: DC | PRN

## 2013-08-22 MED ORDER — DEXMEDETOMIDINE HCL IN NACL 200 MCG/50ML IV SOLN
0.1000 ug/kg/h | INTRAVENOUS | Status: DC
  Filled 2013-08-22: qty 50

## 2013-08-22 MED ORDER — LACTATED RINGERS IV SOLN
INTRAVENOUS | Status: DC | PRN
  Administered 2013-08-22: 07:00:00 via INTRAVENOUS

## 2013-08-22 MED ORDER — LACTATED RINGERS IV SOLN
INTRAVENOUS | Status: DC
Start: 2013-08-22 — End: 2013-08-25

## 2013-08-22 MED ORDER — VITAMIN B-1 100 MG PO TABS
100.0000 mg | ORAL_TABLET | Freq: Every day | ORAL | Status: DC
  Administered 2013-08-23 – 2013-08-28 (×6): 100 mg via ORAL
  Filled 2013-08-22 (×6): qty 1

## 2013-08-22 MED ORDER — MORPHINE SULFATE 2 MG/ML IJ SOLN
1.0000 mg | INTRAMUSCULAR | Status: AC | PRN
  Administered 2013-08-22 (×2): 2 mg via INTRAVENOUS
  Filled 2013-08-22: qty 1

## 2013-08-22 MED ORDER — HEPARIN SODIUM (PORCINE) 1000 UNIT/ML IJ SOLN
INTRAMUSCULAR | Status: AC
  Filled 2013-08-22: qty 1

## 2013-08-22 MED ORDER — ASPIRIN EC 325 MG PO TBEC
325.0000 mg | DELAYED_RELEASE_TABLET | Freq: Every day | ORAL | Status: DC
  Administered 2013-08-23 – 2013-08-25 (×3): 325 mg via ORAL
  Filled 2013-08-22 (×3): qty 1

## 2013-08-22 MED ORDER — ACETAMINOPHEN 160 MG/5ML PO SOLN
1000.0000 mg | Freq: Four times a day (QID) | ORAL | Status: DC
  Administered 2013-08-22 – 2013-08-23 (×2): 1000 mg
  Filled 2013-08-22 (×2): qty 40

## 2013-08-22 MED ORDER — SODIUM CHLORIDE 0.9 % IV SOLN: INTRAVENOUS | Status: DC

## 2013-08-22 MED ORDER — ALBUMIN HUMAN 5 % IV SOLN
250.0000 mL | INTRAVENOUS | Status: AC | PRN
  Administered 2013-08-22 – 2013-08-23 (×4): 250 mL via INTRAVENOUS
  Filled 2013-08-22 (×2): qty 250

## 2013-08-22 MED ORDER — FENTANYL CITRATE 0.05 MG/ML IJ SOLN
INTRAMUSCULAR | Status: DC | PRN
  Administered 2013-08-22: 50 ug via INTRAVENOUS
  Administered 2013-08-22: 100 ug via INTRAVENOUS
  Administered 2013-08-22: 200 ug via INTRAVENOUS
  Administered 2013-08-22 (×2): 50 ug via INTRAVENOUS
  Administered 2013-08-22 (×3): 250 ug via INTRAVENOUS
  Administered 2013-08-22: 150 ug via INTRAVENOUS
  Administered 2013-08-22: 100 ug via INTRAVENOUS
  Administered 2013-08-22: 300 ug via INTRAVENOUS

## 2013-08-22 MED ORDER — ARTIFICIAL TEARS OP OINT
TOPICAL_OINTMENT | OPHTHALMIC | Status: AC
  Filled 2013-08-22: qty 3.5

## 2013-08-22 MED ORDER — SODIUM CHLORIDE 0.9 % IV SOLN
0.5000 g/h | Freq: Once | INTRAVENOUS | Status: DC
  Filled 2013-08-22: qty 20

## 2013-08-22 MED ORDER — SUCCINYLCHOLINE CHLORIDE 20 MG/ML IJ SOLN
INTRAMUSCULAR | Status: AC
  Filled 2013-08-22: qty 1

## 2013-08-22 MED ORDER — SODIUM CHLORIDE 0.9 % IJ SOLN
OROMUCOSAL | Status: DC | PRN
  Administered 2013-08-22 (×3): via TOPICAL

## 2013-08-22 MED ORDER — OXYCODONE HCL 5 MG PO TABS
5.0000 mg | ORAL_TABLET | ORAL | Status: DC | PRN
  Administered 2013-08-23 – 2013-08-25 (×7): 10 mg via ORAL
  Filled 2013-08-22 (×7): qty 2

## 2013-08-22 MED ORDER — DEXTROSE 5 % IV SOLN
1.5000 g | Freq: Two times a day (BID) | INTRAVENOUS | Status: AC
  Administered 2013-08-22 – 2013-08-24 (×4): 1.5 g via INTRAVENOUS
  Filled 2013-08-22 (×4): qty 1.5

## 2013-08-22 MED ORDER — ADULT MULTIVITAMIN W/MINERALS CH
1.0000 | ORAL_TABLET | Freq: Every day | ORAL | Status: DC
  Administered 2013-08-23 – 2013-08-25 (×3): 1 via ORAL
  Filled 2013-08-22 (×3): qty 1

## 2013-08-22 MED ORDER — DOCUSATE SODIUM 100 MG PO CAPS
200.0000 mg | ORAL_CAPSULE | Freq: Every day | ORAL | Status: DC
  Administered 2013-08-23 – 2013-08-25 (×3): 200 mg via ORAL
  Filled 2013-08-22 (×3): qty 2

## 2013-08-22 MED ORDER — HEPARIN SODIUM (PORCINE) 1000 UNIT/ML IJ SOLN
INTRAMUSCULAR | Status: DC | PRN
  Administered 2013-08-22: 2000 [IU] via INTRAVENOUS
  Administered 2013-08-22: 3000 [IU] via INTRAVENOUS
  Administered 2013-08-22: 20000 [IU] via INTRAVENOUS

## 2013-08-22 MED ORDER — DEXMEDETOMIDINE HCL IN NACL 400 MCG/100ML IV SOLN
0.4000 ug/kg/h | Freq: Once | INTRAVENOUS | Status: DC
  Filled 2013-08-22: qty 100

## 2013-08-22 MED ORDER — ACETAMINOPHEN 500 MG PO TABS
1000.0000 mg | ORAL_TABLET | Freq: Four times a day (QID) | ORAL | Status: DC
  Administered 2013-08-23 – 2013-08-25 (×9): 1000 mg via ORAL
  Filled 2013-08-22 (×15): qty 2

## 2013-08-22 MED ORDER — METOPROLOL TARTRATE 12.5 MG HALF TABLET
12.5000 mg | ORAL_TABLET | Freq: Two times a day (BID) | ORAL | Status: DC
  Administered 2013-08-23 – 2013-08-24 (×3): 12.5 mg via ORAL
  Filled 2013-08-22 (×5): qty 1

## 2013-08-22 MED ORDER — EPHEDRINE SULFATE 50 MG/ML IJ SOLN
INTRAMUSCULAR | Status: AC
  Filled 2013-08-22: qty 1

## 2013-08-22 MED ORDER — MIDAZOLAM HCL 2 MG/2ML IJ SOLN
2.0000 mg | INTRAMUSCULAR | Status: DC | PRN
  Administered 2013-08-23: 2 mg via INTRAVENOUS
  Filled 2013-08-22: qty 2

## 2013-08-22 MED ORDER — FAMOTIDINE IN NACL 20-0.9 MG/50ML-% IV SOLN
20.0000 mg | Freq: Two times a day (BID) | INTRAVENOUS | Status: AC
  Administered 2013-08-22 (×2): 20 mg via INTRAVENOUS
  Filled 2013-08-22: qty 50

## 2013-08-22 MED ORDER — SODIUM CHLORIDE 0.9 % IV SOLN
INTRAVENOUS | Status: DC
  Filled 2013-08-22 (×2): qty 1

## 2013-08-22 MED ORDER — SODIUM CHLORIDE 0.9 % IJ SOLN: 3.0000 mL | INTRAMUSCULAR | Status: DC | PRN

## 2013-08-22 MED ORDER — 0.9 % SODIUM CHLORIDE (POUR BTL) OPTIME
TOPICAL | Status: DC | PRN
  Administered 2013-08-22: 5000 mL

## 2013-08-22 MED ORDER — VANCOMYCIN HCL IN DEXTROSE 1-5 GM/200ML-% IV SOLN: 1000.0000 mg | INTRAVENOUS | Status: DC

## 2013-08-22 MED ORDER — METOPROLOL TARTRATE 1 MG/ML IV SOLN: 2.5000 mg | INTRAVENOUS | Status: DC | PRN

## 2013-08-22 MED ORDER — PHENYLEPHRINE HCL 10 MG/ML IJ SOLN: 0.0000 ug/min | INTRAVENOUS | Status: DC

## 2013-08-22 MED ORDER — FOLIC ACID 1 MG PO TABS
1.0000 mg | ORAL_TABLET | Freq: Every day | ORAL | Status: DC
  Administered 2013-08-23 – 2013-08-26 (×4): 1 mg via ORAL
  Filled 2013-08-22 (×5): qty 1

## 2013-08-22 MED ORDER — BISACODYL 5 MG PO TBEC
10.0000 mg | DELAYED_RELEASE_TABLET | Freq: Every day | ORAL | Status: DC
  Administered 2013-08-23 – 2013-08-25 (×3): 10 mg via ORAL
  Filled 2013-08-22 (×3): qty 2

## 2013-08-22 MED ORDER — METHYLPREDNISOLONE SODIUM SUCC 125 MG IJ SOLR
INTRAMUSCULAR | Status: DC | PRN
  Administered 2013-08-22: 250 mg via INTRAVENOUS

## 2013-08-22 MED ORDER — VANCOMYCIN HCL IN DEXTROSE 1-5 GM/200ML-% IV SOLN
1000.0000 mg | Freq: Two times a day (BID) | INTRAVENOUS | Status: AC
  Administered 2013-08-22 – 2013-08-24 (×4): 1000 mg via INTRAVENOUS
  Filled 2013-08-22 (×4): qty 200

## 2013-08-22 MED ORDER — LIDOCAINE HCL (CARDIAC) 20 MG/ML IV SOLN
INTRAVENOUS | Status: DC | PRN
  Administered 2013-08-22: 40 mg via INTRAVENOUS

## 2013-08-22 MED ORDER — MORPHINE SULFATE 2 MG/ML IJ SOLN
2.0000 mg | INTRAMUSCULAR | Status: DC | PRN
  Administered 2013-08-22 – 2013-08-23 (×4): 4 mg via INTRAVENOUS
  Administered 2013-08-23: 2 mg via INTRAVENOUS
  Administered 2013-08-23 (×2): 4 mg via INTRAVENOUS
  Administered 2013-08-23 – 2013-08-24 (×3): 2 mg via INTRAVENOUS
  Filled 2013-08-22 (×6): qty 2
  Filled 2013-08-22 (×5): qty 1

## 2013-08-22 MED ORDER — ACETAMINOPHEN 650 MG RE SUPP
650.0000 mg | Freq: Once | RECTAL | Status: AC
Start: 2013-08-22 — End: 2013-08-22
  Administered 2013-08-22: 650 mg via RECTAL

## 2013-08-22 MED ORDER — VECURONIUM BROMIDE 10 MG IV SOLR
INTRAVENOUS | Status: DC | PRN
  Administered 2013-08-22 (×3): 10 mg via INTRAVENOUS

## 2013-08-22 MED ORDER — METOPROLOL TARTRATE 25 MG/10 ML ORAL SUSPENSION
12.5000 mg | Freq: Two times a day (BID) | ORAL | Status: DC
Start: 2013-08-22 — End: 2013-08-24
  Filled 2013-08-22 (×5): qty 5

## 2013-08-22 MED ORDER — LACTATED RINGERS IV SOLN
INTRAVENOUS | Status: DC | PRN
  Administered 2013-08-22 (×2): via INTRAVENOUS

## 2013-08-22 MED ORDER — TRAZODONE HCL 150 MG PO TABS
150.0000 mg | ORAL_TABLET | Freq: Every day | ORAL | Status: DC
  Administered 2013-08-23 – 2013-08-27 (×5): 150 mg via ORAL
  Filled 2013-08-22 (×7): qty 1

## 2013-08-22 MED ORDER — PHENYLEPHRINE 40 MCG/ML (10ML) SYRINGE FOR IV PUSH (FOR BLOOD PRESSURE SUPPORT)
PREFILLED_SYRINGE | INTRAVENOUS | Status: AC
  Filled 2013-08-22: qty 10

## 2013-08-22 MED ORDER — GEMFIBROZIL 600 MG PO TABS
600.0000 mg | ORAL_TABLET | Freq: Two times a day (BID) | ORAL | Status: DC
  Administered 2013-08-23 – 2013-08-28 (×10): 600 mg via ORAL
  Filled 2013-08-22 (×13): qty 1

## 2013-08-22 MED ORDER — PROTAMINE SULFATE 10 MG/ML IV SOLN
INTRAVENOUS | Status: AC
  Filled 2013-08-22: qty 25

## 2013-08-22 MED ORDER — LACTATED RINGERS IV SOLN
INTRAVENOUS | Status: DC | PRN
  Administered 2013-08-22: 08:00:00 via INTRAVENOUS

## 2013-08-22 MED ORDER — METHYLPREDNISOLONE SODIUM SUCC 125 MG IJ SOLR
INTRAMUSCULAR | Status: AC
  Filled 2013-08-22: qty 4

## 2013-08-22 MED ORDER — MAGNESIUM SULFATE 4000MG/100ML IJ SOLN
4.0000 g | Freq: Once | INTRAMUSCULAR | Status: AC
  Administered 2013-08-22: 4 g via INTRAVENOUS
  Filled 2013-08-22: qty 100

## 2013-08-22 SURGICAL SUPPLY — 158 items
ADAPTER CARDIO PERF ANTE/RETRO (ADAPTER) ×4 IMPLANT
ADAPTER MULTI PERFUSION 15 (ADAPTER) ×2 IMPLANT
ADH SKN CLS APL DERMABOND .7 (GAUZE/BANDAGES/DRESSINGS) ×2
ADH SRG 12 PREFL SYR 3 SPRDR (MISCELLANEOUS)
ADPR PRFSN 84XANTGRD RTRGD (ADAPTER) ×2
ANTEGRADE CPLG (MISCELLANEOUS) ×2 IMPLANT
APL SRG 7X2 LUM MLBL SLNT (VASCULAR PRODUCTS) ×6
APPLICATOR TIP COSEAL (VASCULAR PRODUCTS) ×6 IMPLANT
ATTRACTOMAT 16X20 MAGNETIC DRP (DRAPES) ×4 IMPLANT
BAG DECANTER FOR FLEXI CONT (MISCELLANEOUS) ×4 IMPLANT
BANDAGE ELASTIC 4 VELCRO ST LF (GAUZE/BANDAGES/DRESSINGS) ×4 IMPLANT
BANDAGE ELASTIC 6 VELCRO ST LF (GAUZE/BANDAGES/DRESSINGS) ×4 IMPLANT
BANDAGE GAUZE ELAST BULKY 4 IN (GAUZE/BANDAGES/DRESSINGS) ×4 IMPLANT
BASKET HEART  (ORDER IN 25'S) (MISCELLANEOUS)
BASKET HEART (ORDER IN 25'S) (MISCELLANEOUS)
BASKET HEART (ORDER IN 25S) (MISCELLANEOUS) ×2 IMPLANT
BLADE STERNUM SYSTEM 6 (BLADE) ×4 IMPLANT
BLADE SURG 11 STRL SS (BLADE) ×2 IMPLANT
BLADE SURG 12 STRL SS (BLADE) ×4 IMPLANT
BLADE SURG 15 STRL LF DISP TIS (BLADE) IMPLANT
BLADE SURG 15 STRL SS (BLADE)
BLADE SURG ROTATE 9660 (MISCELLANEOUS) ×2 IMPLANT
CANISTER SUCTION 2500CC (MISCELLANEOUS) ×4 IMPLANT
CANNULA GRAFT 8MMX50CM (Graft) ×2 IMPLANT
CANNULA GUNDRY RCSP 15FR (MISCELLANEOUS) ×4 IMPLANT
CANNULA SUMP PERICARDIAL (CANNULA) ×2 IMPLANT
CANNULA VENOUS LOW PROF 32X40 (CANNULA) IMPLANT
CANNULA VESSEL 3MM BLUNT TIP (CANNULA) ×4 IMPLANT
CARDIAC SUCTION (MISCELLANEOUS) ×4 IMPLANT
CATH CPB KIT VANTRIGT (MISCELLANEOUS) ×2 IMPLANT
CATH RETROPLEGIA CORONARY 14FR (CATHETERS) IMPLANT
CATH ROBINSON RED A/P 18FR (CATHETERS) ×12 IMPLANT
CATH THORACIC 36FR RT ANG (CATHETERS) ×4 IMPLANT
CATH/SQUID NICHOLS JEHLE COR (CATHETERS) ×2 IMPLANT
CAUTERY EYE LOW TEMP 1300F FIN (OPHTHALMIC RELATED) ×2 IMPLANT
CAUTERY HIGH TEMP VAS (MISCELLANEOUS) ×2 IMPLANT
CLIP FOGARTY SPRING 6M (CLIP) IMPLANT
CLIP TI WIDE RED SMALL 24 (CLIP) IMPLANT
CONT SPEC 4OZ CLIKSEAL STRL BL (MISCELLANEOUS) ×4 IMPLANT
COVER SURGICAL LIGHT HANDLE (MISCELLANEOUS) ×6 IMPLANT
CRADLE DONUT ADULT HEAD (MISCELLANEOUS) ×4 IMPLANT
DERMABOND ADVANCED (GAUZE/BANDAGES/DRESSINGS) ×2
DERMABOND ADVANCED .7 DNX12 (GAUZE/BANDAGES/DRESSINGS) IMPLANT
DRAIN CHANNEL 32F RND 10.7 FF (WOUND CARE) ×4 IMPLANT
DRAPE CARDIOVASCULAR INCISE (DRAPES) ×4
DRAPE SLUSH/WARMER DISC (DRAPES) ×4 IMPLANT
DRAPE SRG 135X102X78XABS (DRAPES) ×2 IMPLANT
DRSG AQUACEL AG ADV 3.5X14 (GAUZE/BANDAGES/DRESSINGS) ×4 IMPLANT
ELECT BLADE 4.0 EZ CLEAN MEGAD (MISCELLANEOUS) ×4
ELECT BLADE 6.5 EXT (BLADE) ×4 IMPLANT
ELECT CAUTERY BLADE 6.4 (BLADE) ×4 IMPLANT
ELECT REM PT RETURN 9FT ADLT (ELECTROSURGICAL) ×8
ELECTRODE BLDE 4.0 EZ CLN MEGD (MISCELLANEOUS) ×2 IMPLANT
ELECTRODE REM PT RTRN 9FT ADLT (ELECTROSURGICAL) ×4 IMPLANT
GLOVE BIO SURGEON STRL SZ 6 (GLOVE) ×4 IMPLANT
GLOVE BIO SURGEON STRL SZ 6.5 (GLOVE) ×4 IMPLANT
GLOVE BIO SURGEON STRL SZ7 (GLOVE) ×2 IMPLANT
GLOVE BIO SURGEON STRL SZ7.5 (GLOVE) ×16 IMPLANT
GLOVE BIO SURGEONS STRL SZ 6.5 (GLOVE) ×4
GLOVE BIOGEL PI IND STRL 6 (GLOVE) IMPLANT
GLOVE BIOGEL PI IND STRL 6.5 (GLOVE) IMPLANT
GLOVE BIOGEL PI IND STRL 7.0 (GLOVE) IMPLANT
GLOVE BIOGEL PI INDICATOR 6 (GLOVE) ×6
GLOVE BIOGEL PI INDICATOR 6.5 (GLOVE) ×2
GLOVE BIOGEL PI INDICATOR 7.0 (GLOVE) ×14
GOWN STRL REUS W/ TWL LRG LVL3 (GOWN DISPOSABLE) ×8 IMPLANT
GOWN STRL REUS W/TWL LRG LVL3 (GOWN DISPOSABLE) ×36
GRAFT WOVEN D/V 30DX30L (Vascular Products) ×2 IMPLANT
HANDLE STAPLE ENDO GIA SHORT (STAPLE) ×2
HEMOSTAT POWDER SURGIFOAM 1G (HEMOSTASIS) ×12 IMPLANT
HEMOSTAT SURGICEL 2X14 (HEMOSTASIS) ×4 IMPLANT
INSERT FOGARTY XLG (MISCELLANEOUS) ×2 IMPLANT
KIT BASIN OR (CUSTOM PROCEDURE TRAY) ×4 IMPLANT
KIT ROOM TURNOVER OR (KITS) ×4 IMPLANT
KIT SUCTION CATH 14FR (SUCTIONS) ×4 IMPLANT
KIT VASOVIEW W/TROCAR VH 2000 (KITS) ×4 IMPLANT
LEAD PACING MYOCARDI (MISCELLANEOUS) ×4 IMPLANT
LINE VENT (MISCELLANEOUS) ×2 IMPLANT
LOOP VESSEL MINI RED (MISCELLANEOUS) ×2 IMPLANT
LOOP VESSEL SUPERMAXI WHITE (MISCELLANEOUS) ×2 IMPLANT
MARKER GRAFT CORONARY BYPASS (MISCELLANEOUS) ×8 IMPLANT
NS IRRIG 1000ML POUR BTL (IV SOLUTION) ×20 IMPLANT
PACK OPEN HEART (CUSTOM PROCEDURE TRAY) ×4 IMPLANT
PAD ARMBOARD 7.5X6 YLW CONV (MISCELLANEOUS) ×10 IMPLANT
PAD ELECT DEFIB RADIOL ZOLL (MISCELLANEOUS) ×4 IMPLANT
PENCIL BUTTON HOLSTER BLD 10FT (ELECTRODE) ×4 IMPLANT
PUNCH AORTIC ROTATE 4.0MM (MISCELLANEOUS) IMPLANT
PUNCH AORTIC ROTATE 4.5MM 8IN (MISCELLANEOUS) IMPLANT
PUNCH AORTIC ROTATE 5MM 8IN (MISCELLANEOUS) IMPLANT
RELOAD ENDO GIA 30 2.5 (STAPLE) ×2 IMPLANT
SEALANT SURG COSEAL 8ML (VASCULAR PRODUCTS) ×4 IMPLANT
SET CARDIOPLEGIA MPS 5001102 (MISCELLANEOUS) ×2 IMPLANT
SPONGE GAUZE 4X4 12PLY (GAUZE/BANDAGES/DRESSINGS) ×8 IMPLANT
SPONGE GAUZE 4X4 12PLY STER LF (GAUZE/BANDAGES/DRESSINGS) ×4 IMPLANT
SPONGE LAP 18X18 X RAY DECT (DISPOSABLE) ×4 IMPLANT
SPONGE LAP 4X18 X RAY DECT (DISPOSABLE) ×4 IMPLANT
STAPLER ENDO GIA 12 SHRT THIN (STAPLE) IMPLANT
STAPLER ENDO GIA 12MM SHORT (STAPLE) ×2 IMPLANT
STOPCOCK 4 WAY LG BORE MALE ST (IV SETS) ×2 IMPLANT
SURGIFLO W/THROMBIN 8M KIT (HEMOSTASIS) ×4 IMPLANT
SUT BONE WAX W31G (SUTURE) ×4 IMPLANT
SUT ETHIBON 2 0 V 52N 30 (SUTURE) ×2 IMPLANT
SUT ETHIBON EXCEL 2-0 V-5 (SUTURE) IMPLANT
SUT ETHIBOND 2 0 SH (SUTURE)
SUT ETHIBOND 2 0 SH 36X2 (SUTURE) IMPLANT
SUT ETHIBOND 2 0 V4 (SUTURE) IMPLANT
SUT ETHIBOND 2 0V4 GREEN (SUTURE) IMPLANT
SUT ETHIBOND 4 0 RB 1 (SUTURE) IMPLANT
SUT ETHIBOND V-5 VALVE (SUTURE) IMPLANT
SUT MNCRL AB 4-0 PS2 18 (SUTURE) ×2 IMPLANT
SUT PROLENE 3 0 RB 1 (SUTURE) ×2 IMPLANT
SUT PROLENE 3 0 SH 1 (SUTURE) IMPLANT
SUT PROLENE 3 0 SH DA (SUTURE) ×10 IMPLANT
SUT PROLENE 3 0 SH1 36 (SUTURE) IMPLANT
SUT PROLENE 4 0 RB 1 (SUTURE) ×120
SUT PROLENE 4 0 SH DA (SUTURE) ×4 IMPLANT
SUT PROLENE 4-0 RB1 .5 CRCL 36 (SUTURE) ×2 IMPLANT
SUT PROLENE 5 0 C 1 36 (SUTURE) ×8 IMPLANT
SUT PROLENE 6 0 C 1 30 (SUTURE) IMPLANT
SUT PROLENE 6 0 CC (SUTURE) ×16 IMPLANT
SUT PROLENE 8 0 BV175 6 (SUTURE) IMPLANT
SUT PROLENE BLUE 7 0 (SUTURE) ×4 IMPLANT
SUT SILK  1 MH (SUTURE)
SUT SILK 1 MH (SUTURE) ×4 IMPLANT
SUT SILK 1 TIES 10X30 (SUTURE) ×2 IMPLANT
SUT SILK 2 0 (SUTURE)
SUT SILK 2 0 SH CR/8 (SUTURE) ×6 IMPLANT
SUT SILK 2-0 18XBRD TIE 12 (SUTURE) ×2 IMPLANT
SUT SILK 3 0 SH CR/8 (SUTURE) ×4 IMPLANT
SUT SILK 4 0 (SUTURE)
SUT SILK 4-0 18XBRD TIE 12 (SUTURE) ×2 IMPLANT
SUT STEEL 6MS V (SUTURE) ×8 IMPLANT
SUT STEEL SZ 6 DBL 3X14 BALL (SUTURE) ×4 IMPLANT
SUT TEM PAC WIRE 2 0 SH (SUTURE) ×8 IMPLANT
SUT VIC AB 1 CTX 18 (SUTURE) ×4 IMPLANT
SUT VIC AB 1 CTX 36 (SUTURE) ×8
SUT VIC AB 1 CTX36XBRD ANBCTR (SUTURE) ×4 IMPLANT
SUT VIC AB 2-0 CT1 27 (SUTURE) ×4
SUT VIC AB 2-0 CT1 TAPERPNT 27 (SUTURE) IMPLANT
SUT VIC AB 2-0 CTX 27 (SUTURE) ×6 IMPLANT
SUT VIC AB 3-0 X1 27 (SUTURE) ×2 IMPLANT
SUTURE E-PAK OPEN HEART (SUTURE) ×4 IMPLANT
SYR 10ML KIT SKIN ADHESIVE (MISCELLANEOUS) IMPLANT
SYSTEM SAHARA CHEST DRAIN ATS (WOUND CARE) ×4 IMPLANT
TAPE CLOTH SURG 4X10 WHT LF (GAUZE/BANDAGES/DRESSINGS) ×4 IMPLANT
TOWEL OR 17X24 6PK STRL BLUE (TOWEL DISPOSABLE) ×8 IMPLANT
TOWEL OR 17X26 10 PK STRL BLUE (TOWEL DISPOSABLE) ×8 IMPLANT
TRAY CATH LUMEN 1 20CM STRL (SET/KITS/TRAYS/PACK) ×2 IMPLANT
TRAY FOLEY IC TEMP SENS 14FR (CATHETERS) ×2 IMPLANT
TRAY FOLEY IC TEMP SENS 16FR (CATHETERS) ×4 IMPLANT
TUBE CONNECTING 12'X1/4 (SUCTIONS) ×1
TUBE CONNECTING 12X1/4 (SUCTIONS) ×1 IMPLANT
TUBE SUCT INTRACARD DLP 20F (MISCELLANEOUS) ×2 IMPLANT
TUBING ART PRESS 48 MALE/FEM (TUBING) ×4 IMPLANT
TUBING INSUFFLATION 10FT LAP (TUBING) ×4 IMPLANT
UNDERPAD 30X30 INCONTINENT (UNDERPADS AND DIAPERS) ×4 IMPLANT
WATER STERILE IRR 1000ML POUR (IV SOLUTION) ×8 IMPLANT
YANKAUER SUCT BULB TIP NO VENT (SUCTIONS) ×2 IMPLANT

## 2013-08-22 NOTE — Progress Notes (Addendum)
Patient sedated.  98.8 (37.1)  HR 90 (AAI paced) RR 12 BP 123/76 Art Line BP 117/71 SPO2% 100  VITAL SIGNS: IV 3868.6 (50.5) Blood 2441 NG/GT 30 IV Piggyback 690 Total Intake(mL/kg) 7029.6 (91.7) Urine (mL/kg/hr) 4375 (4) Blood 2100 (1.9) Chest Tube 380 (0.3) Total Output 6855 Net +174  Chest tube output now decreased to 20-30 ml per hour (given 2 FFPs)  FiO2 down to 50%. Will start to wean in order to try to extubate  CI 2.4  Dopamine a 3 mcg/kg/min

## 2013-08-22 NOTE — OR Nursing (Signed)
Inserted patient's urinary catheter without difficulty, balloon inflated without resistance.  Initially returned red urine,  Balloon deflated and reinflated with same 11ml without resistance. Urine continues to drain yellow/pink tinged.  MD aware.

## 2013-08-22 NOTE — Progress Notes (Signed)
  Echocardiogram Echocardiogram Transesophageal has been performed.  Zebulin Siegel L Rachel Samples 08/22/2013, 9:02 AM

## 2013-08-22 NOTE — Anesthesia Postprocedure Evaluation (Signed)
  Anesthesia Post-op Note  Patient: Samuel Long  Procedure(s) Performed: Procedure(s): REPLACEMENT ASCENDING AORTA with hemashield graft (N/A) INTRAOPERATIVE TRANSESOPHAGEAL ECHOCARDIOGRAM (N/A) CORONARY ARTERY BYPASS GRAFTING (CABG) TIMES ONE USING RIGHT THIGH GREATER SAPHENOUS VEIN HARVESTED ENDOSCOPICALLY (N/A)  Patient Location: SICU  Anesthesia Type:General  Level of Consciousness: sedated, unresponsive and Patient remains intubated per anesthesia plan  Airway and Oxygen Therapy: Patient remains intubated per anesthesia plan and Patient placed on Ventilator (see vital sign flow sheet for setting)  Post-op Pain: none  Post-op Assessment: Post-op Vital signs reviewed, Patient's Cardiovascular Status Stable and Respiratory Function Stable  Post-op Vital Signs: stable  Last Vitals:  Filed Vitals:   08/22/13 1815  BP:   Pulse: 90  Temp: 36.8 C  Resp: 12    Complications: No apparent anesthesia complications

## 2013-08-22 NOTE — Brief Op Note (Signed)
08/22/2013  1:33 PM  PATIENT:  Samuel Long  68 y.o. male  PRE-OPERATIVE DIAGNOSIS:  ASCENDING THORACIC AORTIC ANEURYSM  POST-OPERATIVE DIAGNOSIS:  ASCENDING THORACIC AORTIC ANEURYSM   PROCEDURE:  INTRAOPERATIVE TRANSESOPHAGEAL ECHOCARDIOGRAM, REPLACEMENT ASCENDING AORTA using a Heamshild Graft,CORONARY ARTERY BYPASS GRAFTING (CABG) TIMES ONE (SVG to RCA) USING RIGHT THIGH  GREATER SAPHENOUS VEIN HARVESTED ENDOSCOPICALLY   SURGEON:  Surgeon(s) and Role:    * Ivin Poot, MD - Primary  PHYSICIAN ASSISTANT: Lars Pinks PA-C  ANESTHESIA:   general  EBL:  Total I/O In: 1000 [I.V.:1000] Out: 1600 [Urine:1600]  BLOOD ADMINISTERED:Two FFP and one PLTS  DRAINS: Chest tubes in the mediastinal and pleural spaces   SPECIMEN:  Source of Specimen:  Ascending thoracic aortic aneurysm  DISPOSITION OF SPECIMEN:  PATHOLOGY  COUNTS CORRECT:  YES  DICTATION: .Dragon Dictation  PLAN OF CARE: Admit to inpatient   PATIENT DISPOSITION:  ICU - intubated and hemodynamically stable.   Delay start of Pharmacological VTE agent (>24hrs) due to surgical blood loss or risk of bleeding: yes  BASELINE WEIGHT: 77 kg

## 2013-08-22 NOTE — Anesthesia Preprocedure Evaluation (Addendum)
Anesthesia Evaluation  Patient identified by MRN, date of birth, ID band Patient awake    Reviewed: Allergy & Precautions, H&P , NPO status , Patient's Chart, lab work & pertinent test results  History of Anesthesia Complications Negative for: history of anesthetic complications  Airway Mallampati: II TM Distance: >3 FB Neck ROM: Limited    Dental  (+) Teeth Intact, Dental Advisory Given   Pulmonary COPDCurrent Smoker,          Cardiovascular hypertension, Pt. on medications and Pt. on home beta blockers + CAD and + Peripheral Vascular Disease     Neuro/Psych PSYCHIATRIC DISORDERS Depression    GI/Hepatic   Endo/Other    Renal/GU      Musculoskeletal  (+) Arthritis -,   Abdominal   Peds  Hematology   Anesthesia Other Findings   Reproductive/Obstetrics                         Anesthesia Physical Anesthesia Plan  ASA: III  Anesthesia Plan: General   Post-op Pain Management:    Induction: Intravenous  Airway Management Planned: Oral ETT and Video Laryngoscope Planned  Additional Equipment: Arterial line, CVP, 3D TEE and PA Cath  Intra-op Plan:   Post-operative Plan: Post-operative intubation/ventilation  Informed Consent: I have reviewed the patients History and Physical, chart, labs and discussed the procedure including the risks, benefits and alternatives for the proposed anesthesia with the patient or authorized representative who has indicated his/her understanding and acceptance.   Dental advisory given  Plan Discussed with: Anesthesiologist and Surgeon  Anesthesia Plan Comments:        Anesthesia Quick Evaluation

## 2013-08-22 NOTE — Anesthesia Procedure Notes (Signed)
Procedure Name: Intubation Date/Time: 08/22/2013 7:53 AM Performed by: Erik Obey Pre-anesthesia Checklist: Patient being monitored, Patient identified, Emergency Drugs available, Timeout performed and Suction available Patient Re-evaluated:Patient Re-evaluated prior to inductionOxygen Delivery Method: Circle system utilized Preoxygenation: Pre-oxygenation with 100% oxygen Intubation Type: IV induction Ventilation: Mask ventilation without difficulty and Oral airway inserted - appropriate to patient size Laryngoscope Size: Mac and 3 Grade View: Grade I Tube type: Oral Tube size: 8.0 mm Number of attempts: 1 Airway Equipment and Method: Stylet,  Video-laryngoscopy and Oral airway Placement Confirmation: positive ETCO2 and breath sounds checked- equal and bilateral Secured at: 21 cm Tube secured with: Tape Dental Injury: Teeth and Oropharynx as per pre-operative assessment  Comments: Pt with ankylosing

## 2013-08-22 NOTE — Progress Notes (Signed)
Patient noted to have increased chest tube output (266mL in 1.5 hours) and decreased cardiac output and cardiac index.  1 albumin given.  MD Prescott Gum made aware of changes, as well as ABG, coags and EC4 results.  Per MD Prescott Gum:  dopamine started, pacemaker on AAI at 90 bpm, FiO2 increased to 70%, 1 albumin with calcium and 2 units FFP ordered.  Will carry out orders and continue to monitor.

## 2013-08-22 NOTE — Progress Notes (Signed)
The patient was examined and preop studies reviewed. There has been no change from the prior exam and the patient is ready for surgery.   Plan resection and grafting of ascending aortic aneurysm on Samuel Long today

## 2013-08-22 NOTE — Transfer of Care (Signed)
Immediate Anesthesia Transfer of Care Note  Patient: Samuel Long  Procedure(s) Performed: Procedure(s): REPLACEMENT ASCENDING AORTA with hemashield graft (N/A) INTRAOPERATIVE TRANSESOPHAGEAL ECHOCARDIOGRAM (N/A) CORONARY ARTERY BYPASS GRAFTING (CABG) TIMES ONE USING RIGHT THIGH GREATER SAPHENOUS VEIN HARVESTED ENDOSCOPICALLY (N/A)  Patient Location: SICU  Anesthesia Type:General  Level of Consciousness: sedated and Patient remains intubated per anesthesia plan  Airway & Oxygen Therapy: Patient remains intubated per anesthesia plan and Patient placed on Ventilator (see vital sign flow sheet for setting)  Post-op Assessment: Report given to PACU RN and Post -op Vital signs reviewed and stable  Post vital signs: Reviewed and stable  Complications: No apparent anesthesia complications

## 2013-08-23 ENCOUNTER — Inpatient Hospital Stay (HOSPITAL_COMMUNITY): Payer: Medicare Other

## 2013-08-23 ENCOUNTER — Encounter (HOSPITAL_COMMUNITY): Payer: Self-pay | Admitting: Cardiothoracic Surgery

## 2013-08-23 LAB — PREPARE CRYOPRECIPITATE
Unit division: 0
Unit division: 0

## 2013-08-23 LAB — POCT I-STAT 3, ART BLOOD GAS (G3+)
Acid-Base Excess: 6 mmol/L — ABNORMAL HIGH (ref 0.0–2.0)
Acid-Base Excess: 1 mmol/L (ref 0.0–2.0)
Bicarbonate: 29.4 mEq/L — ABNORMAL HIGH (ref 20.0–24.0)
Bicarbonate: 24.7 mEq/L — ABNORMAL HIGH (ref 20.0–24.0)
O2 Saturation: 98 %
O2 Saturation: 99 %
Patient temperature: 38.1
TCO2: 30 mmol/L (ref 0–100)
TCO2: 26 mmol/L (ref 0–100)
pCO2 arterial: 37.8 mmHg (ref 35.0–45.0)
pCO2 arterial: 35.2 mmHg (ref 35.0–45.0)
pH, Arterial: 7.503 — ABNORMAL HIGH (ref 7.350–7.450)
pH, Arterial: 7.455 — ABNORMAL HIGH (ref 7.350–7.450)
pO2, Arterial: 94 mmHg (ref 80.0–100.0)
pO2, Arterial: 122 mmHg — ABNORMAL HIGH (ref 80.0–100.0)

## 2013-08-23 LAB — GLUCOSE, CAPILLARY
Glucose-Capillary: 102 mg/dL — ABNORMAL HIGH (ref 70–99)
Glucose-Capillary: 102 mg/dL — ABNORMAL HIGH (ref 70–99)
Glucose-Capillary: 105 mg/dL — ABNORMAL HIGH (ref 70–99)
Glucose-Capillary: 110 mg/dL — ABNORMAL HIGH (ref 70–99)
Glucose-Capillary: 118 mg/dL — ABNORMAL HIGH (ref 70–99)
Glucose-Capillary: 131 mg/dL — ABNORMAL HIGH (ref 70–99)
Glucose-Capillary: 136 mg/dL — ABNORMAL HIGH (ref 70–99)
Glucose-Capillary: 139 mg/dL — ABNORMAL HIGH (ref 70–99)
Glucose-Capillary: 149 mg/dL — ABNORMAL HIGH (ref 70–99)
Glucose-Capillary: 161 mg/dL — ABNORMAL HIGH (ref 70–99)
Glucose-Capillary: 94 mg/dL (ref 70–99)
Glucose-Capillary: 104 mg/dL — ABNORMAL HIGH (ref 70–99)
Glucose-Capillary: 116 mg/dL — ABNORMAL HIGH (ref 70–99)
Glucose-Capillary: 131 mg/dL — ABNORMAL HIGH (ref 70–99)
Glucose-Capillary: 140 mg/dL — ABNORMAL HIGH (ref 70–99)

## 2013-08-23 LAB — CREATININE, SERUM
CREATININE: 0.46 mg/dL — AB (ref 0.50–1.35)
GFR calc Af Amer: >90 mL/min (ref >90)
GFR calc non Af Amer: >90 mL/min (ref >90)

## 2013-08-23 LAB — PREPARE FRESH FROZEN PLASMA
Unit division: 0
Unit division: 0
Unit division: 0
Unit division: 0
Unit division: 0

## 2013-08-23 LAB — PREPARE PLATELET PHERESIS
Unit division: 0
Unit division: 0

## 2013-08-23 LAB — POCT I-STAT, CHEM 8
BUN: 10 mg/dL (ref 6–23)
Calcium, Ion: 1.13 mmol/L (ref 1.13–1.30)
Chloride: 112 mEq/L (ref 96–112)
Creatinine, Ser: 0.5 mg/dL (ref 0.50–1.35)
Glucose, Bld: 139 mg/dL — ABNORMAL HIGH (ref 70–99)
HCT: 23 % — ABNORMAL LOW (ref 39.0–52.0)
Hemoglobin: 7.8 g/dL — ABNORMAL LOW (ref 13.0–17.0)
Potassium: 3.3 mEq/L — ABNORMAL LOW (ref 3.7–5.3)
Sodium: 134 mEq/L — ABNORMAL LOW (ref 137–147)
TCO2: 27 mmol/L (ref 0–100)

## 2013-08-23 LAB — BASIC METABOLIC PANEL
BUN: 10 mg/dL (ref 6–23)
CO2: 24 meq/L (ref 19–32)
Calcium: 8.3 mg/dL — ABNORMAL LOW (ref 8.4–10.5)
Chloride: 112 mEq/L (ref 96–112)
Creatinine, Ser: 0.47 mg/dL — ABNORMAL LOW (ref 0.50–1.35)
GFR calc Af Amer: >90 mL/min (ref >90)
GFR calc non Af Amer: >90 mL/min (ref >90)
Glucose, Bld: 89 mg/dL (ref 70–99)
Potassium: 4.1 mEq/L (ref 3.7–5.3)
SODIUM: 148 meq/L — AB (ref 137–147)

## 2013-08-23 LAB — CBC
HCT: 28.9 % — ABNORMAL LOW (ref 39.0–52.0)
HCT: 26.7 % — ABNORMAL LOW (ref 39.0–52.0)
HEMOGLOBIN: 9.8 g/dL — AB (ref 13.0–17.0)
HEMOGLOBIN: 9.1 g/dL — AB (ref 13.0–17.0)
MCH: 32.6 pg (ref 26.0–34.0)
MCH: 32.9 pg (ref 26.0–34.0)
MCHC: 33.9 g/dL (ref 30.0–36.0)
MCHC: 34.1 g/dL (ref 30.0–36.0)
MCV: 96 fL (ref 78.0–100.0)
MCV: 96.4 fL (ref 78.0–100.0)
Platelets: 186 10*3/uL (ref 150–400)
Platelets: 196 10*3/uL (ref 150–400)
RBC: 3.01 MIL/uL — AB (ref 4.22–5.81)
RBC: 2.77 MIL/uL — ABNORMAL LOW (ref 4.22–5.81)
RDW: 14.1 % (ref 11.5–15.5)
RDW: 14.1 % (ref 11.5–15.5)
WBC: 8.9 10*3/uL (ref 4.0–10.5)
WBC: 10.2 10*3/uL (ref 4.0–10.5)

## 2013-08-23 LAB — TYPE AND SCREEN
ABO/RH(D): AB POS
Antibody Screen: NEGATIVE

## 2013-08-23 LAB — MAGNESIUM
Magnesium: 2.2 mg/dL (ref 1.5–2.5)
Magnesium: 2.2 mg/dL (ref 1.5–2.5)

## 2013-08-23 MED ORDER — FUROSEMIDE 10 MG/ML IJ SOLN
20.0000 mg | Freq: Once | INTRAMUSCULAR | Status: AC
  Administered 2013-08-23: 20 mg via INTRAVENOUS

## 2013-08-23 MED ORDER — THIAMINE HCL 100 MG/ML IJ SOLN
100.0000 mg | Freq: Every day | INTRAMUSCULAR | Status: DC
  Administered 2013-08-23: 100 mg via INTRAVENOUS
  Filled 2013-08-23 (×2): qty 1

## 2013-08-23 MED ORDER — LEVALBUTEROL HCL 1.25 MG/0.5ML IN NEBU
1.2500 mg | INHALATION_SOLUTION | Freq: Four times a day (QID) | RESPIRATORY_TRACT | Status: DC
  Filled 2013-08-23 (×4): qty 0.5

## 2013-08-23 MED ORDER — FUROSEMIDE 10 MG/ML IJ SOLN
20.0000 mg | Freq: Once | INTRAMUSCULAR | Status: AC
  Filled 2013-08-23: qty 2

## 2013-08-23 MED ORDER — POTASSIUM CHLORIDE 10 MEQ/50ML IV SOLN
10.0000 meq | INTRAVENOUS | Status: AC
  Administered 2013-08-23 – 2013-08-24 (×4): 10 meq via INTRAVENOUS
  Filled 2013-08-23 (×3): qty 50

## 2013-08-23 MED ORDER — LEVALBUTEROL HCL 1.25 MG/0.5ML IN NEBU
1.2500 mg | INHALATION_SOLUTION | Freq: Four times a day (QID) | RESPIRATORY_TRACT | Status: DC | PRN
  Filled 2013-08-23: qty 0.5

## 2013-08-23 MED ORDER — INSULIN ASPART 100 UNIT/ML ~~LOC~~ SOLN: 0.0000 [IU] | SUBCUTANEOUS | Status: DC

## 2013-08-23 MED ORDER — SODIUM CHLORIDE 0.9 % IJ SOLN
10.0000 mL | INTRAMUSCULAR | Status: DC | PRN
  Administered 2013-08-24: 20 mL

## 2013-08-23 MED ORDER — ALBUMIN HUMAN 5 % IV SOLN: 250.0000 mL | INTRAVENOUS | Status: AC | PRN

## 2013-08-23 MED ORDER — BUDESONIDE-FORMOTEROL FUMARATE 160-4.5 MCG/ACT IN AERO
2.0000 | INHALATION_SPRAY | Freq: Two times a day (BID) | RESPIRATORY_TRACT | Status: DC
Start: 2013-08-23 — End: 2013-08-28
  Administered 2013-08-23 – 2013-08-28 (×11): 2 via RESPIRATORY_TRACT
  Filled 2013-08-23: qty 6

## 2013-08-23 MED ORDER — NICOTINE 14 MG/24HR TD PT24
14.0000 mg | MEDICATED_PATCH | Freq: Every day | TRANSDERMAL | Status: DC
  Administered 2013-08-23 – 2013-08-28 (×6): 14 mg via TRANSDERMAL
  Filled 2013-08-23 (×6): qty 1

## 2013-08-23 MED ORDER — POTASSIUM CHLORIDE 10 MEQ/50ML IV SOLN
10.0000 meq | Freq: Once | INTRAVENOUS | Status: AC
  Administered 2013-08-23: 10 meq via INTRAVENOUS
  Filled 2013-08-23: qty 50

## 2013-08-23 MED ORDER — INSULIN ASPART 100 UNIT/ML ~~LOC~~ SOLN
0.0000 [IU] | SUBCUTANEOUS | Status: DC
  Administered 2013-08-23 – 2013-08-24 (×3): 2 [IU] via SUBCUTANEOUS

## 2013-08-23 MED ORDER — SODIUM CHLORIDE 0.9 % IJ SOLN
10.0000 mL | Freq: Two times a day (BID) | INTRAMUSCULAR | Status: DC
  Administered 2013-08-23 – 2013-08-24 (×2): 10 mL
  Administered 2013-08-24: 20 mL
  Administered 2013-08-25 – 2013-08-26 (×2): 10 mL

## 2013-08-23 MED FILL — Mannitol IV Soln 20%: INTRAVENOUS | Qty: 500 | Status: AC

## 2013-08-23 MED FILL — Potassium Chloride Inj 2 mEq/ML: INTRAVENOUS | Qty: 40 | Status: AC

## 2013-08-23 MED FILL — Lidocaine HCl IV Inj 20 MG/ML: INTRAVENOUS | Qty: 2 | Status: AC

## 2013-08-23 MED FILL — Sodium Bicarbonate IV Soln 8.4%: INTRAVENOUS | Qty: 50 | Status: AC

## 2013-08-23 MED FILL — Sodium Chloride IV Soln 0.9%: INTRAVENOUS | Qty: 3000 | Status: AC

## 2013-08-23 MED FILL — Magnesium Sulfate Inj 50%: INTRAMUSCULAR | Qty: 10 | Status: AC

## 2013-08-23 MED FILL — Heparin Sodium (Porcine) Inj 1000 Unit/ML: INTRAMUSCULAR | Qty: 30 | Status: AC

## 2013-08-23 MED FILL — Heparin Sodium (Porcine) Inj 1000 Unit/ML: INTRAMUSCULAR | Qty: 20 | Status: AC

## 2013-08-23 MED FILL — Electrolyte-R (PH 7.4) Solution: INTRAVENOUS | Qty: 6000 | Status: AC

## 2013-08-23 NOTE — Progress Notes (Signed)
Attempted to begin rapid wean protocol.  Pt became very agitated on vent, hypotensive and diaphoretic.  Pt placed back on full support by RT.  Will continue to monitor and attempt wean again as pt tolerates.

## 2013-08-23 NOTE — Clinical Documentation Improvement (Signed)
Presents for repair of thoracic aneurysm. Nurse's note states " continues to be hypotensive (83/63, 95/51) requiring increase in pressors and additional volume".   Patient on Dopamine and Neo Synephrine drips  Please provide a diagnosis associated with the above clinical data and treatment provided in next progress note and discharge summary.  Septic Shock Cardiogenic Shock Hypovolemic Shock Hemorrhagic Shock Other Condition  Thank You, Zoila Shutter ,RN Clinical Documentation Specialist:  [phone]  [employer]

## 2013-08-23 NOTE — Procedures (Signed)
Extubation Procedure Note  Patient Details:   Name: Samuel Long DOB: [birth year] MRN: [id number]   Airway Documentation:     Evaluation  O2 sats: stable throughout Complications: No apparent complications Patient did tolerate procedure well. Bilateral Breath Sounds: Clear;Diminished   Yes  Pt tolerated SICU rapid wean, achieved 1.0L VC, -40 NIF, positive for cuff leak, extubated to 4lpm [place]. No stridor or dyspnea noted after extubation. Pt achieved around 1256mL X2 with IS. All vitals are within normal limits at this time. RT will continue to monitor.   Mariam Dollar 08/23/2013, 9:32 AM

## 2013-08-23 NOTE — Progress Notes (Signed)
Utilization Review Completed.  

## 2013-08-23 NOTE — Progress Notes (Signed)
Pt placed back on full support at this time due to increase in agitation, diaphoresis, and hypotension. Will continue to monitor.

## 2013-08-23 NOTE — Progress Notes (Signed)
Wean protocol initiated at this time

## 2013-08-23 NOTE — Addendum Note (Signed)
Addendum created 08/23/13 1531 by Roberts Gaudy, MD   Modules edited: BPA Follow-up Actions, Clinical Notes   Clinical Notes:  File: [id number]; Pend: [id number]; Pend: [id number]

## 2013-08-23 NOTE — Progress Notes (Signed)
Pt continues to be hypotensive requiring increase in pressors and additional volume.  Pt also continues to be anxious, diaphoretic and not tolerating the vent.  2mg  IV Versed given.  Will continue to monitor.

## 2013-08-23 NOTE — Progress Notes (Signed)
Patient ID: Samuel Long, male   DOB: [birth year], 68 y.o.   MRN: [id number]  SICU Evening Rounds:  Hemodynamically stable  Urine output good  CT output low.  CBC    Component Value Date/Time   WBC 8.9 08/23/2013 0430   RBC 3.01* 08/23/2013 0430   HGB 9.8* 08/23/2013 0430   HCT 28.9* 08/23/2013 0430   PLT 186 08/23/2013 0430   MCV 96.0 08/23/2013 0430   MCH 32.6 08/23/2013 0430   MCHC 33.9 08/23/2013 0430   RDW 14.1 08/23/2013 0430   LYMPHSABS 2.2 07/15/2013 1403   MONOABS 1.0 07/15/2013 1403   EOSABS 0.2 07/15/2013 1403   BASOSABS 0.0 07/15/2013 1403    BMET    Component Value Date/Time   NA 148* 08/23/2013 0430   K 4.1 08/23/2013 0430   CL 112 08/23/2013 0430   CO2 24 08/23/2013 0430   GLUCOSE 89 08/23/2013 0430   BUN 10 08/23/2013 0430   CREATININE 0.47* 08/23/2013 0430   CALCIUM 8.3* 08/23/2013 0430   GFRNONAA >90 08/23/2013 0430   GFRAA >90 08/23/2013 0430    A/P: stable day. Continue present course.

## 2013-08-23 NOTE — Addendum Note (Signed)
Addendum created 08/23/13 1610 by Roberts Gaudy, MD   Modules edited: Clinical Notes   Clinical Notes:  File: [id number]; Pend: [id number]; Pend: [id number]; Raelyn Number: [id number]; Pend: [id number]; [place]: [phone]

## 2013-08-23 NOTE — Progress Notes (Signed)
Patient extubated, tolerated well

## 2013-08-23 NOTE — Progress Notes (Signed)
While patient was transferring from bed to chair he became tachycardic into the 120's. Patient asymptomatic, SBP holding 110-120, no chest pain noted. He is diaphoretic but that started before he got out of bed and occasionally happened throughout the day. Patient states that he feels like it is due to 'nerves'. PRN Morphine given for pain relief primarily located in lower chest at this time. Will continue to monitor. Richardean Sale, RN

## 2013-08-23 NOTE — CV Procedure (Signed)
Intraoperative Transesophageal Echocardiography Report:  Samuel Long is a 68 year old male with a thoracic ascending fusiform aneurysm. Its last measurement was 4.9 cm with a recent growth in diameter. He  was also found single vessel coronary disease involving the right coronary artery after the takeoff of the PDA. He is now scheduled to undergo replacement of the ascending aorta and coronary artery bypass grafting x1. Intraoperative transesophageal echocardiography was requested to evaluate the aortic valve, to assess for any other valvular pathology, and to serve as a monitor for intraoperative right and left ventricular function.  The patient was brought to the operating room at [facility] and general anesthesia was induced without difficulty. Following endotracheal intubation and orogastric suctioning, the transesophageal echocardiography probe was inserted into the esophagus without difficulty.  Impression: Pre-bypass Findings:  1. Aortic valve: The aortic valve was trileaflet. The annulus was enlarged and measured 2.6 cm. The leaflets opened without restriction. There was no thickening or calcification of the leaflets. There was trace aortic insufficiency.  2. Ascending aorta: The ascending aorta was enlarged and measured 4.28 cm at the sinuses of Valsalva, sinotubular junction was 3.65 cm, and the  ascending aorta at the level of the right main pulmonary artery was 3.9 cm. There was no dissection noted. There was no significant atheromatous disease noted within the wall of ascending aorta.  3. Mitral valve: The mitral valve appeared to open normally without restriction. There was trace mitral insufficiency. There was no thickening of the leaflets. There were no prolapsing or flail leaflet segments noted.  4. Left ventricle: There appeared to be normal left ventricular function. The left ventricular end-diastolic diameter measured 4.86 cm at the mid-papillary level in the  transgastric short axis view. Left ventricular wall thickness measured 1.01 cm of the anterior wall and 0.95 cm at the posterior wall at end diastole in the transgastric short axis view. No regional wall motion abnormalities were noted and there was good contractility in all segments interrogated. The ejection fraction was estimated at 60-65%.  5. Right ventricle: The right ventricular cavity was of normal size. There was normal contractility the right ventricular free wall and normal lateral tricuspid annular plane excursion in systole.  6. Tricuspid valve: The tricuspid valve appeared structurally normal and there was trace tricuspid insufficiency.   7. Interatrial septum: The interatrial septum was intact without evidence of patent foramen ovale or atrial septal defect. This was assessed by both color Doppler and bubble study.  8. Left atrium: There was no thrombus noted within the left atrium or left atrial appendage.  9. Descending aorta: The descending aorta was of normal diameter with no significant atheromatous disease appreciated. The descending aorta measured 2.8 centimeters.  Post-bypass findings:  1. Aortic valve: The aortic valve appeared unchanged from the pre-bypass study. The leaflets opened without restriction there was trace aortic insufficiency.  2. Ascending aorta: The  ascending aortic graft could not be well visualized in the upper esophageal views.  3. Mitral valve: The mitral valve appeared unchanged from the pre-bypass study. There was trace mitral insufficiency and no restriction to opening.  4. Left ventricle: The left ventricular cavity was within normal limits of size with good contractility in all segments interrogated and the ejection fraction was estimated at 60-65%.  5. Right ventricle: There was normal appearing right ventricular function. The right ventricular size was normal and there was normal contractility of the right ventricular free wall and normal  tricuspid annular systolic excursion.  Roberts Gaudy,  M.D.

## 2013-08-23 NOTE — Progress Notes (Signed)
Anesthesiology Follow-up:  Awake and alert, neuro intact in good spirits. Complaining of back discomfort with sitting in chair. Hemodynamically stable.  VS: T- 36.7 BP- 114/68 RR 15 HR 105 (SR) O2 Sat 95% on 1L  K-4.1 Glucose - 114 BUN/Cr.: 10/0.47 H/H 9.8/28.9 Plts 186,000   Dopamine 3 mcg/kg/min for BP support  Extubated at 09:33 today. Doing well so far no apparent complications  Roberts Gaudy, MD

## 2013-08-24 ENCOUNTER — Inpatient Hospital Stay (HOSPITAL_COMMUNITY): Payer: Medicare Other

## 2013-08-24 LAB — BASIC METABOLIC PANEL
BUN: 14 mg/dL (ref 6–23)
CO2: 25 mEq/L (ref 19–32)
Calcium: 8.2 mg/dL — ABNORMAL LOW (ref 8.4–10.5)
Chloride: 103 mEq/L (ref 96–112)
Creatinine, Ser: 0.47 mg/dL — ABNORMAL LOW (ref 0.50–1.35)
GFR calc Af Amer: >90 mL/min (ref >90)
GFR calc non Af Amer: >90 mL/min (ref >90)
Glucose, Bld: 103 mg/dL — ABNORMAL HIGH (ref 70–99)
Potassium: 4.1 mEq/L (ref 3.7–5.3)
Sodium: 137 mEq/L (ref 137–147)

## 2013-08-24 LAB — GLUCOSE, CAPILLARY
Glucose-Capillary: 100 mg/dL — ABNORMAL HIGH (ref 70–99)
Glucose-Capillary: 117 mg/dL — ABNORMAL HIGH (ref 70–99)
Glucose-Capillary: 122 mg/dL — ABNORMAL HIGH (ref 70–99)
Glucose-Capillary: 129 mg/dL — ABNORMAL HIGH (ref 70–99)

## 2013-08-24 LAB — CBC
HCT: 25.4 % — ABNORMAL LOW (ref 39.0–52.0)
Hemoglobin: 8.5 g/dL — ABNORMAL LOW (ref 13.0–17.0)
MCH: 32.9 pg (ref 26.0–34.0)
MCHC: 33.5 g/dL (ref 30.0–36.0)
MCV: 98.4 fL (ref 78.0–100.0)
Platelets: 174 10*3/uL (ref 150–400)
RBC: 2.58 MIL/uL — ABNORMAL LOW (ref 4.22–5.81)
RDW: 14.4 % (ref 11.5–15.5)
WBC: 9.2 10*3/uL (ref 4.0–10.5)

## 2013-08-24 LAB — POCT I-STAT 3, ART BLOOD GAS (G3+)
Acid-Base Excess: 2 mmol/L (ref 0.0–2.0)
Bicarbonate: 25.8 mEq/L — ABNORMAL HIGH (ref 20.0–24.0)
O2 Saturation: 92 %
Patient temperature: 98.4
TCO2: 27 mmol/L (ref 0–100)
pCO2 arterial: 37.5 mmHg (ref 35.0–45.0)
pH, Arterial: 7.446 (ref 7.350–7.450)
pO2, Arterial: 62 mmHg — ABNORMAL LOW (ref 80.0–100.0)

## 2013-08-24 MED ORDER — BIOTENE DRY MOUTH MT LIQD
15.0000 mL | Freq: Two times a day (BID) | OROMUCOSAL | Status: DC
  Administered 2013-08-24 – 2013-08-28 (×8): 15 mL via OROMUCOSAL

## 2013-08-24 MED ORDER — POTASSIUM CHLORIDE 10 MEQ/50ML IV SOLN
INTRAVENOUS | Status: AC
  Filled 2013-08-24: qty 50

## 2013-08-24 MED ORDER — FUROSEMIDE 10 MG/ML IJ SOLN
40.0000 mg | Freq: Two times a day (BID) | INTRAMUSCULAR | Status: AC
  Administered 2013-08-24 (×2): 40 mg via INTRAVENOUS
  Filled 2013-08-24: qty 4

## 2013-08-24 MED ORDER — KETOROLAC TROMETHAMINE 15 MG/ML IJ SOLN
15.0000 mg | Freq: Four times a day (QID) | INTRAMUSCULAR | Status: DC | PRN
  Administered 2013-08-24: 15 mg via INTRAVENOUS
  Filled 2013-08-24: qty 1

## 2013-08-24 MED ORDER — TRAMADOL HCL 50 MG PO TABS
50.0000 mg | ORAL_TABLET | Freq: Four times a day (QID) | ORAL | Status: DC
  Administered 2013-08-24 – 2013-08-28 (×17): 50 mg via ORAL
  Filled 2013-08-24 (×18): qty 1

## 2013-08-24 MED ORDER — METOPROLOL TARTRATE 25 MG PO TABS
25.0000 mg | ORAL_TABLET | Freq: Two times a day (BID) | ORAL | Status: DC
  Administered 2013-08-24 – 2013-08-25 (×2): 25 mg via ORAL
  Filled 2013-08-24 (×3): qty 1

## 2013-08-24 MED ORDER — POTASSIUM CHLORIDE CRYS ER 20 MEQ PO TBCR
40.0000 meq | EXTENDED_RELEASE_TABLET | Freq: Two times a day (BID) | ORAL | Status: AC
  Administered 2013-08-24 (×2): 40 meq via ORAL
  Filled 2013-08-24 (×2): qty 2

## 2013-08-24 MED ORDER — METOPROLOL TARTRATE 25 MG/10 ML ORAL SUSPENSION
25.0000 mg | Freq: Two times a day (BID) | ORAL | Status: DC
  Filled 2013-08-24 (×3): qty 10

## 2013-08-24 NOTE — Progress Notes (Signed)
2 Days Post-Op Procedure(s) (LRB): REPLACEMENT ASCENDING AORTA with hemashield graft (N/A) INTRAOPERATIVE TRANSESOPHAGEAL ECHOCARDIOGRAM (N/A) CORONARY ARTERY BYPASS GRAFTING (CABG) TIMES ONE USING RIGHT THIGH GREATER SAPHENOUS VEIN HARVESTED ENDOSCOPICALLY (N/A) Subjective:  Complains of not sleeping overnight due to being uncomfortable ( usually has to sleep on his side), bottom hurts, on chronic narcotics for anklosing spondylitis.   Objective: Vital signs in last 24 hours: Temp:  [97.6 F (36.4 C)-99.7 F (37.6 C)] 98.8 F (37.1 C) (05/09 0736) Pulse Rate:  [94-118] 111 (05/09 0900) Cardiac Rhythm:  [-] Normal sinus rhythm;Sinus tachycardia (05/09 0800) Resp:  [12-44] 28 (05/09 0900) BP: (91-140)/(53-88) 140/88 mmHg (05/09 0900) SpO2:  [73 %-99 %] 99 % (05/09 0907) Arterial Line BP: (95-137)/(59-81) 137/76 mmHg (05/09 0900) Weight:  [84.9 kg (187 lb 2.7 oz)] 84.9 kg (187 lb 2.7 oz) (05/09 0400)  Hemodynamic parameters for last 24 hours: PAP: (29)/(17-18) 29/17 mmHg  Intake/Output from previous day: 05/08 0701 - 05/09 0700 In: 2564.2 [P.O.:1440; I.V.:524.2; IV Piggyback:600] Out: 1980 [Urine:1600; Chest Tube:380] Intake/Output this shift: Total I/O In: 400 [P.O.:200; IV Piggyback:200] Out: 60 [Urine:60]  General appearance: alert and cooperative Neurologic: intact Heart: regular rate and rhythm, S1, S2 normal, no murmur, click, rub or gallop Lungs: clear to auscultation bilaterally Extremities: edema mild Wound: dressing dry  Lab Results:  Recent Labs  08/23/13 1700 08/23/13 1747 08/24/13 0400  WBC 10.2  --  9.2  HGB 9.1* 7.8* 8.5*  HCT 26.7* 23.0* 25.4*  PLT 196  --  174   BMET:  Recent Labs  08/23/13 0430  08/23/13 1747 08/24/13 0400  NA 148*  --  134* 137  K 4.1  --  3.3* 4.1  CL 112  --  112 103  CO2 24  --   --  25  GLUCOSE 89  --  139* 103*  BUN 10  --  10 14  CREATININE 0.47*  < > 0.50 0.47*  CALCIUM 8.3*  --   --  8.2*  < > = values in  this interval not displayed.  PT/INR:  Recent Labs  08/22/13 1613  LABPROT 21.2*  INR 1.90*   ABG    Component Value Date/Time   PHART 7.446 08/24/2013 0415   HCO3 25.8* 08/24/2013 0415   TCO2 27 08/24/2013 0415   ACIDBASEDEF 0.4 08/20/2013 1513   O2SAT 92.0 08/24/2013 0415   CBG (last 3)   Recent Labs  08/23/13 1949 08/24/13 0021 08/24/13 0410  GLUCAP 140* 129* 117*   CXR: mild bibasilar atelectasis  Assessment/Plan: S/P Procedure(s) (LRB): REPLACEMENT ASCENDING AORTA with hemashield graft (N/A) INTRAOPERATIVE TRANSESOPHAGEAL ECHOCARDIOGRAM (N/A) CORONARY ARTERY BYPASS GRAFTING (CABG) TIMES ONE USING RIGHT THIGH GREATER SAPHENOUS VEIN HARVESTED ENDOSCOPICALLY (N/A) Mobilize Diuresis Diabetes control d/c tubes/lines Will add toradol to help with pain since creatinine is normal   LOS: 2 days    Gaye Pollack 08/24/2013

## 2013-08-24 NOTE — Progress Notes (Signed)
Patient ID: Samuel Long, male   DOB: 1946/02/11, 68 y.o.   MRN: [id number]  SICU Evening Rounds:  Hemodynamically stable  Excellent diuresis today.  Will keep foley in until tomorrow since diuresing so much.

## 2013-08-25 ENCOUNTER — Inpatient Hospital Stay (HOSPITAL_COMMUNITY): Payer: Medicare Other

## 2013-08-25 LAB — BASIC METABOLIC PANEL
BUN: 13 mg/dL (ref 6–23)
CALCIUM: 8.4 mg/dL (ref 8.4–10.5)
CO2: 26 mEq/L (ref 19–32)
CREATININE: 0.54 mg/dL (ref 0.50–1.35)
Chloride: 102 mEq/L (ref 96–112)
GFR calc Af Amer: >90 mL/min (ref >90)
GFR calc non Af Amer: >90 mL/min (ref >90)
Glucose, Bld: 93 mg/dL (ref 70–99)
Potassium: 4.2 mEq/L (ref 3.7–5.3)
Sodium: 138 mEq/L (ref 137–147)

## 2013-08-25 LAB — CBC
HEMATOCRIT: 24.9 % — AB (ref 39.0–52.0)
Hemoglobin: 8.4 g/dL — ABNORMAL LOW (ref 13.0–17.0)
MCH: 33.3 pg (ref 26.0–34.0)
MCHC: 33.7 g/dL (ref 30.0–36.0)
MCV: 98.8 fL (ref 78.0–100.0)
Platelets: 176 10*3/uL (ref 150–400)
RBC: 2.52 MIL/uL — ABNORMAL LOW (ref 4.22–5.81)
RDW: 14.2 % (ref 11.5–15.5)
WBC: 7.7 10*3/uL (ref 4.0–10.5)

## 2013-08-25 MED ORDER — FUROSEMIDE 40 MG PO TABS
40.0000 mg | ORAL_TABLET | Freq: Every day | ORAL | Status: AC
  Administered 2013-08-25 – 2013-08-27 (×3): 40 mg via ORAL
  Filled 2013-08-25 (×4): qty 1

## 2013-08-25 MED ORDER — ASPIRIN EC 325 MG PO TBEC
325.0000 mg | DELAYED_RELEASE_TABLET | Freq: Every day | ORAL | Status: DC
  Administered 2013-08-26 – 2013-08-28 (×3): 325 mg via ORAL
  Filled 2013-08-25 (×4): qty 1

## 2013-08-25 MED ORDER — DOCUSATE SODIUM 100 MG PO CAPS
200.0000 mg | ORAL_CAPSULE | Freq: Every day | ORAL | Status: DC
  Administered 2013-08-26 – 2013-08-28 (×2): 200 mg via ORAL
  Filled 2013-08-25 (×3): qty 2

## 2013-08-25 MED ORDER — SODIUM CHLORIDE 0.9 % IJ SOLN: 3.0000 mL | INTRAMUSCULAR | Status: DC | PRN

## 2013-08-25 MED ORDER — SODIUM CHLORIDE 0.9 % IV SOLN: 250.0000 mL | INTRAVENOUS | Status: DC | PRN

## 2013-08-25 MED ORDER — POTASSIUM CHLORIDE CRYS ER 20 MEQ PO TBCR
20.0000 meq | EXTENDED_RELEASE_TABLET | Freq: Two times a day (BID) | ORAL | Status: AC
  Administered 2013-08-25 – 2013-08-27 (×5): 20 meq via ORAL
  Filled 2013-08-25 (×5): qty 1

## 2013-08-25 MED ORDER — METOPROLOL TARTRATE 25 MG PO TABS
25.0000 mg | ORAL_TABLET | Freq: Two times a day (BID) | ORAL | Status: DC
  Administered 2013-08-25 – 2013-08-28 (×6): 25 mg via ORAL
  Filled 2013-08-25 (×8): qty 1

## 2013-08-25 MED ORDER — SODIUM CHLORIDE 0.9 % IJ SOLN
3.0000 mL | Freq: Two times a day (BID) | INTRAMUSCULAR | Status: DC
  Administered 2013-08-25 – 2013-08-27 (×4): 3 mL via INTRAVENOUS

## 2013-08-25 MED ORDER — MOVING RIGHT ALONG BOOK
Freq: Once | Status: AC
  Administered 2013-08-25: 15:00:00
  Filled 2013-08-25: qty 1

## 2013-08-25 MED ORDER — BISACODYL 5 MG PO TBEC: 10.0000 mg | DELAYED_RELEASE_TABLET | Freq: Every day | ORAL | Status: DC | PRN

## 2013-08-25 MED ORDER — BISACODYL 10 MG RE SUPP: 10.0000 mg | Freq: Every day | RECTAL | Status: DC | PRN

## 2013-08-25 NOTE — Op Note (Signed)
NAMENORLAN, RANN NO.:  [id number]  MEDICAL RECORD NO.:  [id number]  LOCATION:  2S02C                        FACILITY:  [facility]  PHYSICIAN:  Ivin Poot, M.D.  DATE OF BIRTH:  14-Nov-1945  DATE OF PROCEDURE:  08/24/2013 DATE OF DISCHARGE:                              OPERATIVE REPORT   OPERATION: 1. Resection and grafting of fusiform 5.2 cm ascending aneurysm with a     30-mm Hemashield graft with preservation of aortic valve. 2. Coronary artery bypass grafting x1 utilizing saphenous vein graft     to right coronary artery. 3. Endoscopic harvest right leg greater saphenous vein. 4. Placement of femoral A-line for blood pressure monitoring.  PREOPERATIVE DIAGNOSES: 1. Fusiform ascending thoracic aneurysm with increase in size over the     past 12 months. 2. Chronic obstructive pulmonary disease with active smoking. 3. Single-vessel coronary artery disease.  SURGEON:  Ivin Poot, MD  ASSISTANT:  Lars Pinks, PA  ANESTHESIA:  General.  INDICATIONS:  The patient is a 68 year old Caucasian male smoker also with a history of alcohol abuse in the past, who presents with an expanding fusiform ascending aneurysm over 5 cm.  Cardiac catheterization demonstrated 70% stenosis of the RCA just past the bifurcation.  A 2D echo showed no significant aortic valve disease with EF of 55%.  The patient is felt to be candidate for resection grafting of his ascending aorta and combined CABG.  Prior to surgery, I examined the patient in the office on several occasions and reviewed the indications, benefits, and risks of the above and the prescribed operation.  I reviewed the major aspects of surgery including the location of the surgical incisions, including axillary artery exposure, use of general anesthesia, use of cardiopulmonary bypass, use of hypothermic circulatory arrest, and the harvest of the right leg saphenous vein.  I discussed the benefits  including improved survival and prevention of aortic dissection.  I discussed the risks to him of the surgery including the risks of stroke, MI, bleeding, blood transfusion requirement, ventilator dependence with multi organ failure, postoperative pleural effusion, postoperative arrhythmia and possible pacemaker requirement.  After reviewing these issues, he demonstrated his understanding and agreed to proceed with surgery under what I felt was an informed consent.  OPERATIVE FINDINGS: 1. Diffusely enlarged ascending aneurysm extending up to the arch. 2. Trileaflet aortic valve component. 3. Highly calcified RCA plaque with successful CABG to the distal RCA. 4. Hypothermic circulatory arrest with antegrade cerebral perfusion to     perform the distal aortic anastomosis with a circulatory arrest     time of 50 minutes during which the brain remained perfused and     monitored with a cerebral oximeter.  OPERATIVE PROCEDURE:  The patient was brought to the operating room and placed supine on the operating table where general anesthesia was induced.  The chest, abdomen, and legs were prepped with Betadine and draped as a sterile field.  A femoral A-line was placed for blood pressure monitoring.  An incision was made in the right deltopectoral groove.  Using the deep retractors, the pectoralis major and pectoralis muscle was divided.  The right subclavian artery was  identified and the brachial plexus gently retracted aside.  The vessel loops were placed around the subclavian artery and heparin was administered 4000 units. The artery was then clamped and an incision was made-arteriotomy.  An 8- mm Gore-Tex graft-conduit was then sewn end-to-side with running 5-0 Prolene and the clamps were removed.  There was good hemostasis and the incision was packed with a 4 x 18 gauze and then covered.  The cannulae remained clamped.  A sternal incision was then made.  The sternum was divided  and retracted.  The pericardium was opened and suspended.  The aorta and heart were examined.  The aorta was diffusely enlarged up to over 5 cm. The dilatation extended up to the arch.  The right coronary had a heavily calcified plaque.  The ventricle appeared to be hypertrophied but without scarring.  Heparin was administered and pursestrings were placed in the right atrium for both venous cannulation and for the retrograde coronary sinus cardioplegia catheter.  When the ACT was documented as being therapeutic, the arterial line from the axillary artery was connected to the pump line (inflow) and the 2 stage venous cannula was placed to the right atrium.  The patient was then placed on cardiopulmonary bypass.  A vent was placed via the right superior pulmonary vein.  The right coronary was dissected out and the vein graft was prepared for the distal anastomoses.  The aorta was separated from the PA carefully.  A vessel loop was placed around the innominate artery for later clamping during antegrade cerebral perfusion.  The patient was then cooled down to 20 degrees.  The aortic cross-clamp was applied. One liter of cold blood cardioplegia was delivered in split doses between the antegrade aortic and retrograde coronary sinus catheters. There was good cardioplegic arrest and septal temperature dropped less than 14 degrees.  The aorta was divided just above the sinotubular junction and below the crossclamp.  He was dilated but there were no intimal ulcerations or hematoma.  The aortic valve inspected.  Appeared to be perfectly content.  The coronary ostia were widely patent.  It was decided to use a straight graft to reconstruct the ascending aorta.  At this point, the vein graft was sewn to the distal RCA using a running 7-0 Prolene to this 1.5-mm vessel.  There was good flow through the graft and cardioplegia was delivered to the vein graft.  Attention was then directed to the aortic  repair.  A 30-mm Hemashield graft was selected for use.  This was sewn first to the aorta above the sinotubular junction with a running 3-0 Prolene around the posterior aspect of the anastomosis.  This was reinforced with several interrupted pledgeted 4-0 Prolene sutures.  The running suture was then completed around the anterior aspect of the anastomosis and the outside of the anterior aspect of the anastomosis were reinforced with another set of interrupted 4-0 Prolene pledgeted sutures.  At this point, the patient reached 20 degrees and hypothermic circulatory arrest was achieved.  The patient was drained of all blood to the pump, and a clamp was placed on the innominate artery just above the arch.  Antegrade cerebral perfusion at 750 mL/minute via the right subclavian vein was then established.  There was good response on the cerebral oximeter monitor.  The clamp was removed from the aorta.  The aorta was resected just to the proximal arch beneath the innominate artery.  The graft was then cut to the appropriate angle and length and then sewn  end-to-end to the aorta using running 3-0 Prolene around the posterior aspect of the anastomosis which was reinforced with several interrupted 4-0 pledgeted Prolene sutures.  The running suture was then completed on the anterior aspect of the anastomosis and again the interrupted 4-0 Prolene pledgeted sutures were placed around the outside to reinforce the anastomosis.  A vent was placed in the graft.  A small opening was then made using the electrocautery for the proximal vein anastomosis to the graft which was constructed using running 6-0 Prolene.  At this point, flow was resumed to the entire body after the clamp on the innominate artery was removed.  Air was vented from the aorta in the root.  It should be noted during the operation the operative field was insufflated with CO2 to reduce intravascular air bubbles.  The patient was  then slowly rewarmed.  The suture lines were checked and found to be hemostatic.  The heart was cardioverted when the patient reached 28 degrees.  Temporary pacing wires were applied.  When the patient reached 36.5 degrees, the lungs were re-expanded, the ventilator was resumed and the patient was weaned off cardiopulmonary bypass on low- dose milrinone and dopamine.  The echo showed normal functioning of the aortic valve with good LV function.  Protamine was administered.  There was still diffuse coagulopathy and platelet count was low so the patient was given platelets and FFP with improved coagulation function.  The mediastinum was irrigated.  Anterior mediastinal and posterior mediastinal drains were placed and brought out through separate incisions.  The superior pericardial fat was closed over the aorta.  The vein graft had been checked and was hemostatic and had good flow.  The sternum was closed with interrupted steel wire.  The pectoralis fascia was closed in running #1 Vicryl.  The subcutaneous and skin layers were closed in running Vicryl.  The right axillary incision was closed after the Gore-Tex graft had been stapled and divided just at its point of the end-to-side anastomosis with the subclavian artery.  The deltopectoral incision was closed in layers using Vicryl.  Sterile dressings were applied.  Total cardiopulmonary bypass time was 240 minutes.     Ivin Poot, M.D.     PV/MEDQ  D:  08/24/2013  T:  08/25/2013  Job:  [id number]  cc:   Satira Sark, MD Peter M. Martinique, M.D.

## 2013-08-25 NOTE — Progress Notes (Signed)
Pt transferred to 2W38. Pt ambulated to new room without difficulty and tolerated transfer well. Pt placed in chair and placed on telemetry. Pt's wife with pt and aware. Personal belongings transferred with pt's wife. Bedside handoff with RN.   Dellie Catholic, RN

## 2013-08-25 NOTE — Progress Notes (Signed)
3 Days Post-Op Procedure(s) (LRB): REPLACEMENT ASCENDING AORTA with hemashield graft (N/A) INTRAOPERATIVE TRANSESOPHAGEAL ECHOCARDIOGRAM (N/A) CORONARY ARTERY BYPASS GRAFTING (CABG) TIMES ONE USING RIGHT THIGH GREATER SAPHENOUS VEIN HARVESTED ENDOSCOPICALLY (N/A) Subjective:  Feels better, slept well  Objective: Vital signs in last 24 hours: Temp:  [98.2 F (36.8 C)-99.1 F (37.3 C)] 98.2 F (36.8 C) (05/10 0800) Pulse Rate:  [86-105] 105 (05/10 0900) Cardiac Rhythm:  [-] Normal sinus rhythm;Sinus tachycardia (05/10 0800) Resp:  [13-27] 19 (05/10 0900) BP: (92-149)/(59-102) 102/84 mmHg (05/10 0900) SpO2:  [90 %-99 %] 96 % (05/10 0900) Arterial Line BP: (101-121)/(55-69) 121/63 mmHg (05/09 1300) Weight:  [83.734 kg (184 lb 9.6 oz)] 83.734 kg (184 lb 9.6 oz) (05/10 0600)  Hemodynamic parameters for last 24 hours:    Intake/Output from previous day: 05/09 0701 - 05/10 0700 In: 1300 [P.O.:920; I.V.:130; IV Piggyback:250] Out: 0630 [Urine:4520; Chest Tube:40] Intake/Output this shift: Total I/O In: 360 [P.O.:360] Out: 180 [Urine:180]  General appearance: alert and cooperative Neurologic: intact Heart: regular rate and rhythm, S1, S2 normal, no murmur, click, rub or gallop Lungs: clear to auscultation bilaterally Wound: dressing dry and intact  Lab Results:  Recent Labs  08/24/13 0400 08/25/13 0401  WBC 9.2 7.7  HGB 8.5* 8.4*  HCT 25.4* 24.9*  PLT 174 176   BMET:  Recent Labs  08/24/13 0400 08/25/13 0401  NA 137 138  K 4.1 4.2  CL 103 102  CO2 25 26  GLUCOSE 103* 93  BUN 14 13  CREATININE 0.47* 0.54  CALCIUM 8.2* 8.4    PT/INR:  Recent Labs  08/22/13 1613  LABPROT 21.2*  INR 1.90*   ABG    Component Value Date/Time   PHART 7.446 08/24/2013 0415   HCO3 25.8* 08/24/2013 0415   TCO2 27 08/24/2013 0415   ACIDBASEDEF 0.4 08/20/2013 1513   O2SAT 92.0 08/24/2013 0415   CBG (last 3)   Recent Labs  08/24/13 0410 08/24/13 0746 08/24/13 1232  GLUCAP 117*  100* 122*    Assessment/Plan: S/P Procedure(s) (LRB): REPLACEMENT ASCENDING AORTA with hemashield graft (N/A) INTRAOPERATIVE TRANSESOPHAGEAL ECHOCARDIOGRAM (N/A) CORONARY ARTERY BYPASS GRAFTING (CABG) TIMES ONE USING RIGHT THIGH GREATER SAPHENOUS VEIN HARVESTED ENDOSCOPICALLY (N/A) Mobilize Diuresis Diabetes control: Preop Hgb A1c 5.6. I think we can stop CBG's Expected acute blood loss anemia: stable. Plan for transfer to step-down: see transfer orders   LOS: 3 days    Gaye Pollack 08/25/2013

## 2013-08-26 ENCOUNTER — Inpatient Hospital Stay (HOSPITAL_COMMUNITY): Payer: Medicare Other

## 2013-08-26 LAB — CBC
HCT: 25.7 % — ABNORMAL LOW (ref 39.0–52.0)
Hemoglobin: 8.5 g/dL — ABNORMAL LOW (ref 13.0–17.0)
MCH: 32.9 pg (ref 26.0–34.0)
MCHC: 33.1 g/dL (ref 30.0–36.0)
MCV: 99.6 fL (ref 78.0–100.0)
PLATELETS: 208 10*3/uL (ref 150–400)
RBC: 2.58 MIL/uL — ABNORMAL LOW (ref 4.22–5.81)
RDW: 14.1 % (ref 11.5–15.5)
WBC: 7.4 10*3/uL (ref 4.0–10.5)

## 2013-08-26 LAB — BASIC METABOLIC PANEL
BUN: 9 mg/dL (ref 6–23)
CHLORIDE: 102 meq/L (ref 96–112)
CO2: 26 mEq/L (ref 19–32)
Calcium: 8.4 mg/dL (ref 8.4–10.5)
Creatinine, Ser: 0.5 mg/dL (ref 0.50–1.35)
GFR calc Af Amer: >90 mL/min (ref >90)
GFR calc non Af Amer: >90 mL/min (ref >90)
GLUCOSE: 94 mg/dL (ref 70–99)
Potassium: 4 mEq/L (ref 3.7–5.3)
SODIUM: 140 meq/L (ref 137–147)

## 2013-08-26 MED ORDER — DM-GUAIFENESIN ER 30-600 MG PO TB12
1.0000 | ORAL_TABLET | Freq: Two times a day (BID) | ORAL | Status: DC
  Administered 2013-08-26 – 2013-08-28 (×5): 1 via ORAL
  Filled 2013-08-26 (×6): qty 1

## 2013-08-26 NOTE — Progress Notes (Signed)
CARDIAC REHAB PHASE I   PRE:  Rate/Rhythm: 90 SR  BP:  Sitting: 118/82      SaO2: 96 RA  MODE:  Ambulation: 550 ft   POST:  Rate/Rhythm: 101 ST  BP:  Sitting: 124/86     SaO2: 98 RA 1020-1045 Patient amb independently with RW. Steady gait noted. Observed pt ambulating in room with steady gait unassisted,  upon arrival. Also observed pt using pillow for splent. Pt c/o minimal pain while walking. 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 Stated next pain medication is due at noon. Also verbalized that using a walker is cumbersome. Encouraged pt to walk 2 more times today since anticipated discharge could possibly be tomorrow and to use IS.   Samuel Magwood English PayneRN, BSN 08/26/2013 10:48 AM

## 2013-08-26 NOTE — Progress Notes (Signed)
Called as second set of eyes to assess patient with neuro changes.  On arrival patient sitting in chair - alert warm and dry oriented - no focal neuro deficit noted on NIHSS stroke scale.  Wife reports patient had been for long walk with rehab and had been sitting in chair post walk for about 20-30 mins.  Patient had onset of "thick tongue" speech per wife - and she stated the left side of his face went "up."  It lasted for less than 10 minutes before patient returned to baseline.  RN Baxter Flattery states when she was called she noted some slurred speech but no facial droop.  Patients BP at the time per RN Baxter Flattery was 125/88 HR 88 SR O2sats 100% on RA.  Wife and patient both state he is back to baseline - and that this had never happened before.  No new meds noted.  Patient noted to have quite a bit of coughing - thick tan sputum - RN and patient and family state this is not new - has strong smoking history.  RN Baxter Flattery speaking with PA Barnett Applebaum and MD Dr. Darcey Nora.pdate given.  New orders noted.  Coached patient and family to call immediately for any changes.  Patient now at baseline.

## 2013-08-26 NOTE — Progress Notes (Addendum)
Nurse notified Dr. Prescott Gum that speech was garbled and had left sided facial droop. VSS, labs reviewed. VS Q 30 minutes, check CT head without contrast, check ABG. Discontinue narcotics.Close monitoring.   Upon my review, his speech is not garbled at this time. Neuro exam shows no acute deficits. There is no left sided facial droop. Patient's only complaint is that it seems to take longer to "come up with words" but not say them.

## 2013-08-26 NOTE — Progress Notes (Addendum)
       [address]       [place] [address]             [phone]          4 Days Post-Op Procedure(s) (LRB): REPLACEMENT ASCENDING AORTA with hemashield graft (N/A) INTRAOPERATIVE TRANSESOPHAGEAL ECHOCARDIOGRAM (N/A) CORONARY ARTERY BYPASS GRAFTING (CABG) TIMES ONE USING RIGHT THIGH GREATER SAPHENOUS VEIN HARVESTED ENDOSCOPICALLY (N/A)  Subjective: Rested poorly last night, a little weaker today, but overall stable.  Appetite good.  Walking in room without problem.   Objective: Vital signs in last 24 hours: Patient Vitals for the past 24 hrs:  BP Temp Temp src Pulse Resp SpO2 Weight  08/26/13 0413 116/79 mmHg 99.4 F (37.4 C) Oral 94 20 93 % 182 lb 3.2 oz (82.645 kg)  08/25/13 2030 131/93 mmHg 97.8 F (36.6 C) Oral 88 21 100 % -  08/25/13 1243 121/88 mmHg 97.8 F (36.6 C) Oral 92 20 89 % -  08/25/13 1200 - - - - 29 - -  08/25/13 1100 110/78 mmHg - - 93 32 93 % -  08/25/13 1044 - - - - - 96 % -  08/25/13 1000 111/79 mmHg - - 99 22 94 % -  08/25/13 0900 102/84 mmHg - - 105 19 96 % -  08/25/13 0800 113/79 mmHg 98.2 F (36.8 C) Oral 96 20 94 % -   Current Weight  08/26/13 182 lb 3.2 oz (82.645 kg)  BASELINE WEIGHT: 77 kg    Intake/Output from previous day: 05/10 0701 - 05/11 0700 In: 720 [P.O.:720] Out: 2580 [Urine:2580]    PHYSICAL EXAM:  Heart: RRR Lungs: Few fine crackles in bases Wound: Clean and dry Extremities: Mild LE edema    Lab Results: CBC: Recent Labs  08/25/13 0401 08/26/13 0445  WBC 7.7 7.4  HGB 8.4* 8.5*  HCT 24.9* 25.7*  PLT 176 208   BMET:  Recent Labs  08/25/13 0401 08/26/13 0445  NA 138 140  K 4.2 4.0  CL 102 102  CO2 26 26  GLUCOSE 93 94  BUN 13 9  CREATININE 0.54 0.50  CALCIUM 8.4 8.4    PT/INR: No results found for this basename: LABPROT, INR,  in the last 72 hours    Assessment/Plan: S/P Procedure(s) (LRB): REPLACEMENT ASCENDING AORTA with hemashield graft (N/A) INTRAOPERATIVE  TRANSESOPHAGEAL ECHOCARDIOGRAM (N/A) CORONARY ARTERY BYPASS GRAFTING (CABG) TIMES ONE USING RIGHT THIGH GREATER SAPHENOUS VEIN HARVESTED ENDOSCOPICALLY (N/A)  CV- SR, BPs stable.  Continue Lopressor. Restart low dose ACE-I.  Vol overload- diurese.  Expected postop blood loss anemia- H/H generally stable.  Continue to monitor.   CRPI, pulm toilet.  Possibly ready for d/c 1-2 days if remains stable.   LOS: 4 days    Coolidge Breeze 08/26/2013  patient examined after transient neuro sx's nsr  Stable bp Cont ASA 325 daily and check noncontrast head CT and check carotids for flow pulm status influenced by COPD- add mucinex and flutter valve  patient examined and medical record reviewed,agree with above note. Tharon Aquas Trigt 08/26/2013

## 2013-08-26 NOTE — Progress Notes (Addendum)
Called to room by wife for reports of slurred speech and left sided facial droop. Upon assessment, garbled speech present, facial droop absent, pronator drift absent, equal upper extremity grips and movement of all extremities. Rapid Response called to assess patient. MD paged and new orders received for Head CT, ABG, D/C Oxycodone, and more frequent VS. Will continue to monitor pt closely.

## 2013-08-26 NOTE — Progress Notes (Signed)
Central line D/C'd per order. Occlusive dressing applied. Pressure held. PT instructed to stay in bed for 30 mins. Verbalized understanding. Will continue to monitor pt closely.

## 2013-08-27 ENCOUNTER — Inpatient Hospital Stay (HOSPITAL_COMMUNITY): Payer: Medicare Other

## 2013-08-27 DIAGNOSIS — G459 Transient cerebral ischemic attack, unspecified: Secondary | ICD-10-CM

## 2013-08-27 LAB — COMPREHENSIVE METABOLIC PANEL WITH GFR
ALT: 17 U/L (ref 0–53)
AST: 20 U/L (ref 0–37)
Albumin: 2.7 g/dL — ABNORMAL LOW (ref 3.5–5.2)
Alkaline Phosphatase: 65 U/L (ref 39–117)
BUN: 8 mg/dL (ref 6–23)
CO2: 25 meq/L (ref 19–32)
Calcium: 8.6 mg/dL (ref 8.4–10.5)
Chloride: 104 meq/L (ref 96–112)
Creatinine, Ser: 0.43 mg/dL — ABNORMAL LOW (ref 0.50–1.35)
GFR calc Af Amer: >90 mL/min (ref >90)
GFR calc non Af Amer: >90 mL/min (ref >90)
Glucose, Bld: 91 mg/dL (ref 70–99)
Potassium: 4.1 meq/L (ref 3.7–5.3)
Sodium: 138 meq/L (ref 137–147)
Total Bilirubin: 0.6 mg/dL (ref 0.3–1.2)
Total Protein: 5.9 g/dL — ABNORMAL LOW (ref 6.0–8.3)

## 2013-08-27 LAB — CBC
HCT: 26.7 % — ABNORMAL LOW (ref 39.0–52.0)
Hemoglobin: 8.9 g/dL — ABNORMAL LOW (ref 13.0–17.0)
MCH: 33.1 pg (ref 26.0–34.0)
MCHC: 33.3 g/dL (ref 30.0–36.0)
MCV: 99.3 fL (ref 78.0–100.0)
Platelets: 258 10*3/uL (ref 150–400)
RBC: 2.69 MIL/uL — ABNORMAL LOW (ref 4.22–5.81)
RDW: 14.1 % (ref 11.5–15.5)
WBC: 7.1 10*3/uL (ref 4.0–10.5)

## 2013-08-27 MED ORDER — TRAMADOL HCL 50 MG PO TABS: 50.0000 mg | ORAL_TABLET | Freq: Four times a day (QID) | ORAL | Status: DC | PRN

## 2013-08-27 MED ORDER — LISINOPRIL 5 MG PO TABS
5.0000 mg | ORAL_TABLET | Freq: Every day | ORAL | Status: DC
  Administered 2013-08-27 – 2013-08-28 (×2): 5 mg via ORAL
  Filled 2013-08-27 (×2): qty 1

## 2013-08-27 MED ORDER — METOPROLOL TARTRATE 25 MG PO TABS: 25.0000 mg | ORAL_TABLET | Freq: Two times a day (BID) | ORAL | Status: DC

## 2013-08-27 MED ORDER — BUDESONIDE-FORMOTEROL FUMARATE 160-4.5 MCG/ACT IN AERO: 2.0000 | INHALATION_SPRAY | Freq: Two times a day (BID) | RESPIRATORY_TRACT | Status: DC

## 2013-08-27 MED ORDER — POTASSIUM CHLORIDE ER 20 MEQ PO TBCR: 20.0000 meq | EXTENDED_RELEASE_TABLET | Freq: Every day | ORAL | Status: DC

## 2013-08-27 MED ORDER — FUROSEMIDE 40 MG PO TABS: 40.0000 mg | ORAL_TABLET | Freq: Every day | ORAL | Status: DC

## 2013-08-27 MED ORDER — NICOTINE 14 MG/24HR TD PT24: 14.0000 mg | MEDICATED_PATCH | Freq: Every day | TRANSDERMAL | Status: DC

## 2013-08-27 MED ORDER — FE FUMARATE-B12-VIT C-FA-IFC PO CAPS
1.0000 | ORAL_CAPSULE | Freq: Every day | ORAL | Status: DC
  Administered 2013-08-27 – 2013-08-28 (×2): 1 via ORAL
  Filled 2013-08-27 (×2): qty 1

## 2013-08-27 MED ORDER — FE FUMARATE-B12-VIT C-FA-IFC PO CAPS: 1.0000 | ORAL_CAPSULE | Freq: Every day | ORAL | Status: DC

## 2013-08-27 MED ORDER — LISINOPRIL 10 MG PO TABS: 10.0000 mg | ORAL_TABLET | Freq: Every day | ORAL | Status: DC

## 2013-08-27 NOTE — Progress Notes (Signed)
*  PRELIMINARY RESULTS* Vascular Ultrasound Carotid Duplex (Doppler) has been completed. Findings suggest 1-39% internal carotid artery stenosis bilaterally. Vertebral arteries are patent with antegrade flow.  08/27/2013 2:01 PM Maudry Mayhew, RVT, RDCS, RDMS

## 2013-08-27 NOTE — Discharge Instructions (Signed)
Activity: 1.May walk up steps                2.No lifting more than ten pounds for four weeks.                 3.No driving for four weeks.                4.Stop any activity that causes chest pain, shortness of breath, dizziness, sweating or excessive weakness.                5.Avoid straining.                6.Continue with your breathing exercises daily.  Diet: Low fat, Low salt diet  Wound Care: May shower.  Clean wounds with mild soap and water daily. Contact the office at [phone] if any problems arise.  Coronary Artery Bypass Grafting, Care After Refer to this sheet in the next few weeks. These instructions provide you with information on caring for yourself after your procedure. Your health care provider may also give you more specific instructions. Your treatment has been planned according to current medical practices, but problems sometimes occur. Call your health care provider if you have any problems or questions after your procedure. WHAT TO EXPECT AFTER THE PROCEDURE Recovery from surgery will be different for everyone. Some people feel well after 3 or 4 weeks, while for others it takes longer. After your procedure, it is typical to have the following:  Nausea and a lack of appetite.   Constipation.  Weakness and fatigue.   Depression or irritability.   Pain or discomfort at your incision site. HOME CARE INSTRUCTIONS  Only take over-the-counter or prescription medicines as directed by your health care provider. Take all medicines exactly as directed. Do not stop taking medicines or start any new medicines without first checking with your health care provider.   Take your pulse as directed by your health care provider.  Perform deep breathing as directed by your health care provider. If you were given a device called an incentive spirometer, use it to practice deep breathing several times a day. Support your chest with a pillow or your arms when you take deep  breaths or cough.  Keep incision areas clean, dry, and protected. Remove or change any bandages (dressings) only as directed by your health care provider. You may have skin adhesive strips over the incision areas. Do not take the strips off. They will fall off on their own.  Check incision areas daily for any swelling, redness, or drainage.  If incisions were made in your legs, do the following:  Avoid crossing your legs.   Avoid sitting for long periods of time. Change positions every 30 minutes.   Elevate your legs when you are sitting.   Wear compression stockings as directed by your health care provider. These stockings help keep blood clots from forming in your legs.  Take showers once your health care provider approves. Until then, only take sponge baths. Pat incisions dry. Do not rub incisions with a washcloth or towel. Do not take tub baths or go swimming until your health care provider approves.  Eat foods that are high in fiber, such as raw fruits and vegetables, whole grains, beans, and nuts. Meats should be lean cut. Avoid canned, processed, and fried foods.  Drink enough fluids to keep your urine clear or pale yellow.  Weigh yourself every day. This helps identify if you are retaining fluid that  may make your heart and lungs work harder.   Rest and limit activity as directed by your health care provider. You may be instructed to:  Stop any activity at once if you have chest pain, shortness of breath, irregular heartbeats, or dizziness. Get help right away if you have any of these symptoms.  Move around frequently for short periods or take short walks as directed by your health care provider. Increase your activities gradually. You may need physical therapy or cardiac rehabilitation to help strengthen your muscles and build your endurance.  Avoid lifting, pushing, or pulling anything heavier than 10 lb (4.5 kg) for at least 6 weeks after surgery.  Do not drive until  your health care provider approves.  Ask your health care provider when you may return to work and resume sexual activity.  Follow up with your health care provider as directed.  SEEK MEDICAL CARE IF:  You have swelling, redness, increasing pain, or drainage at the site of an incision.   You develop a fever.   You have swelling in your ankles or legs.   You have pain in your legs.   You have weight gain of 2 or more pounds a day.  You are nauseous or vomit.  You have diarrhea. SEEK IMMEDIATE MEDICAL CARE IF:  You have chest pain that goes to your jaw or arms.  You have shortness of breath.   You have a fast or irregular heartbeat.   You notice a "clicking" in your breastbone (sternum) when you move.   You have numbness or weakness in your arms or legs.  You feel dizzy or lightheaded.  MAKE SURE YOU:  Understand these instructions.  Will watch your condition.  Will get help right away if you are not doing well or get worse. Document Released: 10/22/2004 Document Revised: 12/05/2012 Document Reviewed: 09/11/2012 [facility] Patient Information 2014 [place]  Aortic Valve Replacement Care After Refer to this sheet in the next few weeks. These instructions provide you with information on caring for yourself after your procedure. Your caregiver may also give you specific instructions. Your treatment has been planned according to current medical practices, but problems sometimes occur. Call your caregiver if you have any problems or questions after your procedure. HOME CARE INSTRUCTIONS   Only take over-the-counter or prescription medicines as directed by your caregiver.  Take your temperature every morning for the first 7 days after surgery. Write these down. Call your caregiver if your temperature stays above 100 F (37.8 C) for more than a day.   Weigh yourself every morning for at least 7 days after surgery. Write your weight down to monitor any  weight increase.  Wear elastic stockings during the day for at least 2 weeks after surgery. Use them longer if your ankles are swollen. The stockings help blood flow and help reduce swelling in the legs.  Take frequent naps or rest often throughout the day.  Avoid lifting over 10 lbs (4.5 kg) or pushing or pulling things with your arms for 6 8 weeks or as directed by your caregiver.  Avoid driving or airplane travel for 4 6 weeks after surgery or as directed. If you are riding in a car for an extended period, stop every 1 2 hours to stretch your legs. Keep a record of your medicines and medical history with you when traveling.  Do not cross your legs.  Do not take baths for 4 6 weeks after surgery. Take showers once your caregiver approves.  Pat incisions dry. Do not rub incisions with a washcloth or towel.  Avoid climbing stairs and using the handrail to pull yourself up for the first 2 3 weeks after surgery.  Return to work as directed by your caregiver.  Drink enough fluids to keep your urine clear or pale yellow.  Do not strain to have a bowel movement. Eat high-fiber foods if you become constipated. You may also take a medicine to help you have a bowel movement (laxative) as directed by your caregiver.  Resume sexual activity as directed by your caregiver. Men should not use medicines for erectile dysfunction until their doctor says it isokay.  If you had a certain type of heart condition in the past, you may need to take antibiotic medicine before having dental work or surgery. Let your dentist and caregivers know if you had one or more of the following:  Previous endocarditis.  An artificial (prosthetic) heart valve.  Congenital heart disease. SEEK MEDICAL CARE IF:  You develop a skin rash.   Your weight is increasing each day over 2 3 days, or you have a sudden weight gain.  Your weight increases by 2 or more pounds (1 kg or more) in a single day. SEEK IMMEDIATE  MEDICAL CARE IF:   You develop chest pain that is not coming from your incision.   You develop shortness of breath or have difficulty breathing.   You have a fever.   You have increased bleeding from your wounds.   You have increasing wound pain.   You have redness or swelling around your wounds  You have pus coming from your wound.   You develop lightheadedness.  MAKE SURE YOU:   Understand these directions.  Will watch your condition.  Will get help right away if you are not doing well or get worse. Document Released: 10/21/2004 Document Revised: 03/21/2012 Document Reviewed: 01/17/2012 [facility] Patient Information 2014 [place], [place].

## 2013-08-27 NOTE — Discharge Summary (Signed)
[address]       [place] [address]             [phone]              Discharge Summary  Name: Samuel Long DOB: 1946-03-07 68 y.o. MRN: [id number]   Admission Date: 08/22/2013 Discharge Date: 08/28/2013    Admitting Diagnosis: Thoracic aortic aneurysm  Discharge Diagnosis:  Thoracic aortic aneurysm Expected postoperative blood loss anemia  Past Medical History  Diagnosis Date  . Spondylitis, ankylosing   . Essential hypertension, benign   . BPH (benign prostatic hyperplasia)   . Depression   . PTSD (post-traumatic stress disorder)   . Cataracts, bilateral   . Left rib fracture   . Coronary artery disease   . COPD (chronic obstructive pulmonary disease)     mild  . Arthritis   . Thoracic aortic aneurysm     4.7 x 4.5 cm  April 2013  . AAA (abdominal aortic aneurysm)     hx of TAA, but no AAA identified on ultrasound 12/29/11      Procedures: RESECTION AND GRAFTING OF ASCENDING AORTIC ANEURYSM (30 mm Hemashield graft) CORONARY ARTERY BYPASS GRAFTING x 1 (Saphenous vein graft to right coronary artery) ENDOSCOPIC VEIN HARVEST RIGHT LEG - 08/22/2013   HPI:  The patient is a 68 y.o. male with a known history of 5 cm fusiform ascending thoracic aneurysm. This has slowly increased in size over the past 3 years. Recent 2-D echocardiogram shows no significant aortic valve disease with good LV function. Recent right and left heart catheterization by Dr. Martinique via right radial artery demonstrates no significant coronary disease with a mild 50% stenosis of the posterior lateral branch right coronary. Right heart catheterization demonstrates normal pressures with cardiac index of 2.2 and mixed venous saturation of 60%. The patient's CT scans previously have been done without contrast. The aortic injection at the time of catheterization does not shows the extent of the aneurysm to the arch.The patient was seen in consultation by Dr. Prescott Gum for  consideration of surgical repair, and it was recommended that he proceed at this time. All risks, benefits and alternatives of surgery were explained in detail, and the patient agreed to proceed.       Hospital Course:  The patient was admitted to [facility] on 08/22/2013. The patient was taken to the operating room and underwent the above procedure.    The postoperative course was notable for an episode of confusion, slurred speech and left facial droop which occurred following a walk with cardiac rehab.  A CT of the head showed no acute abnormalities, and the patient returned quickly to his baseline mental status. Narcotics were discontinued and he was treated conservatively.  A carotid doppler study was performed and showed no significant carotid artery stenosis bilaterally .  Otherwise, he has done well.  He has remained stable from a cardiac standpoint and has had no other neurologic changes.  The patient is ambulating in the halls without difficulty and tolerating a regular diet.  Incisions are healing well. There has been a mild blood loss anemia which has not required transfusion and for which he was started on iron.  He is being diuresed for mild volume overload.  We anticipate discharge home in the next 24-48 hours provided no acute changes occur.      Recent vital signs:  Filed Vitals:   08/28/13 0349  BP: 126/88  Pulse: 96  Temp: 98.4 F (36.9 C)  Resp: 18    Recent laboratory studies:  CBC:  Recent Labs  08/26/13 0445 08/27/13 0453  WBC 7.4 7.1  HGB 8.5 8.9  HCT 25.7 26.7  PLT 208 258   BMET:   Recent Labs  08/26/13 0445 08/27/13 0453  NA 140 138  K 4.0 4.1  CL 102 104  CO2 26 25  GLUCOSE 94 91  BUN 9 8  CREATININE 0.50 0.43  CALCIUM 8.4 8.6    PT/INR: No results found for this basename: LABPROT, INR,  in the last 72 hours   Discharge Medications:     Medication List    STOP taking these medications       amLODipine 5 MG tablet  Commonly known  as:  NORVASC     HYDROcodone-acetaminophen 10-325 MG per tablet  Commonly known as:  NORCO      TAKE these medications       aspirin 325 MG tablet  Take 325 mg by mouth daily.     budesonide-formoterol 160-4.5 MCG/ACT inhaler  Commonly known as:  SYMBICORT  Inhale 2 puffs into the lungs 2 (two) times daily.     ferrous [id number] C-folic acid capsule  Commonly known as:  TRINSICON / FOLTRIN  Take 1 capsule by mouth daily with lunch. For one month then stop.     furosemide 40 MG tablet  Commonly known as:  LASIX  Take 1 tablet (40 mg total) by mouth daily. For 4 days then stop.     gemfibrozil 600 MG tablet  Commonly known as:  LOPID  Take 600 mg by mouth 2 (two) times daily before a meal.     lisinopril 5 MG tablet  Commonly known as:  PRINIVIL,ZESTRIL  Take 1 tablet (5 mg total) by mouth daily.     metoprolol tartrate 25 MG tablet  Commonly known as:  LOPRESSOR  Take 1 tablet (25 mg total) by mouth 2 (two) times daily.     multivitamin with minerals Tabs tablet  Take 1 tablet by mouth daily.     nicotine 14 mg/24hr patch  Commonly known as:  NICODERM CQ - dosed in mg/24 hours  Place 1 patch (14 mg total) onto the skin daily.     Potassium Chloride ER 20 MEQ Tbcr  Take 20 mEq by mouth daily. For 4 days then stop.     traMADol 50 MG tablet  Commonly known as:  ULTRAM  Take 1-2 tablets (50-100 mg total) by mouth every 6 (six) hours as needed.     traZODone 150 MG tablet  Commonly known as:  DESYREL  Take 150 mg by mouth at bedtime.       The patient has been discharged on:   1.Beta Blocker:  Yes [ x  ]                              No   [   ]                              If No, reason:  2.Ace Inhibitor/ARB: Yes [  x ]                                     No  [    ]  If No, reason:  3.Statin:   Yes [   ]                  No  [ x  ]                  If No, reason:On Lopid  4.Shela Commons:  Yes  [  x ]                   No   [   ]                  If No, reason:  Discharge Instructions:  The patient is to refrain from driving, heavy lifting or strenuous activity.  May shower daily and clean incisions with soap and water.  May resume regular diet.   Follow Up: Follow-up Information   Follow up with VAN Wilber Oliphant, MD On 09/25/2013. (Have a chest x-ray at [facility] at 10:00  , then see MD at 11:00)    Specialty:  Cardiothoracic Surgery   Contact information:   [address] [place] [address] [phone]       Follow up with Peter Martinique, MD.   Specialty:  Cardiology   Contact information:   [address] [place]  [address] [phone]        Future Appointments Provider Department Dept Phone   09/16/2013 2:50 PM Lendon Colonel, NP [facility] [place] [phone]   09/25/2013 11:00 AM Ivin Poot, MD [facility] [phone]        [place] Liston Alba PA-C 08/28/2013, 7:31 AM

## 2013-08-27 NOTE — Progress Notes (Signed)
Pt ambulated 600 ft pushing walker in hallway with wife. No complaints will continue to monitor pt closely.

## 2013-08-27 NOTE — Progress Notes (Signed)
Bloody drainage noted from R CT site. Edges approximated. 4x4 placed over site. Will continue to monitor pt closely.

## 2013-08-27 NOTE — Progress Notes (Addendum)
      [address]       [place] [address]             [phone]        5 Days Post-Op Procedure(s) (LRB): REPLACEMENT ASCENDING AORTA with hemashield graft (N/A) INTRAOPERATIVE TRANSESOPHAGEAL ECHOCARDIOGRAM (N/A) CORONARY ARTERY BYPASS GRAFTING (CABG) TIMES ONE USING RIGHT THIGH GREATER SAPHENOUS VEIN HARVESTED ENDOSCOPICALLY (N/A)  Subjective: Patient without complaints this morning. He denies any difficulty speaking, swallowing, visual disturbances.  Objective: Vital signs in last 24 hours: Temp:  [97.6 F (36.4 C)-98.9 F (37.2 C)] 98.9 F (37.2 C) (05/12 0455) Pulse Rate:  [88-92] 89 (05/12 0455) Cardiac Rhythm:  [-] Normal sinus rhythm (05/12 0746) Resp:  [18] 18 (05/12 0455) BP: (122-137)/(82-93) 136/92 mmHg (05/12 0455) SpO2:  [95 %-100 %] 95 % (05/12 0455) Weight:  [175 lb 7.8 oz (79.6 kg)] 175 lb 7.8 oz (79.6 kg) (05/12 0455)  Pre op weight 77 kg Current Weight  08/27/13 175 lb 7.8 oz (79.6 kg)      Intake/Output from previous day: 05/11 0701 - 05/12 0700 In: 720 [P.O.:720] Out: 675 [Urine:675]   Physical Exam:  Cardiovascular: RRR Pulmonary: Coarse breath sounds this am Abdomen: Soft, non tender, bowel sounds present. Extremities: Mild bilateral lower extremity edema. Wounds: Clean and dry.  No erythema or signs of infection. Neurologic: Grossly intact without focal deficits  Lab Results: CBC: Recent Labs  08/26/13 0445 08/27/13 0453  WBC 7.4 7.1  HGB 8.5* 8.9*  HCT 25.7* 26.7*  PLT 208 258   BMET:  Recent Labs  08/26/13 0445 08/27/13 0453  NA 140 138  K 4.0 4.1  CL 102 104  CO2 26 25  GLUCOSE 94 91  BUN 9 8  CREATININE 0.50 0.43*  CALCIUM 8.4 8.6    PT/INR:  Lab Results  Component Value Date   INR 1.90* 08/22/2013   INR 1.01 08/20/2013   INR 1.1* 07/15/2013   ABG:  INR: Will add last result for INR, ABG once components are confirmed Will add last 4 CBG results once components are  confirmed  Assessment/Plan:  1. CV - SR in the 90's. On Lopressor 25 bid. Will restart low dose ACE for better bp control. 2.  Pulmonary - Encourage incentive spirometer 3. Volume Overload - On Lasix 40 daily 4.  Acute blood loss anemia - H and H stable at 8.9 and 26.7. Start Trinsicon 5.Neurologic-CT head done yesterday showed no acute intracranial abnormalities.May have had a TIA. Check duplex carotid US. No further facial droop or garbled speech.  6. Remove EPW 7. Possible discharge in am  Donielle M ZimmermanPA-C 08/27/2013,7:57 AM  Agree with above assessment and note Home on 325 ASA daily   Carotid duplex still pending

## 2013-08-27 NOTE — Progress Notes (Addendum)
EPW removed per order. Ends intact. VSS. Pt instructed of one hour bedrest. Verbalized understanding. CT sutures removed. 1/2" steri strips and Benzoin applied per order. Will continue to monitor pt cloesly.

## 2013-08-27 NOTE — Progress Notes (Signed)
CARDIAC REHAB PHASE I   PRE:  Rate/Rhythm: 90 SR  BP:  Supine:  Sitting: 100/64  Standing:    SaO2: 95 RA  MODE:  Ambulation: 890 ft   POST:  Rate/Rhythm: 90  BP:  Supine:   Sitting: 140/90  Standing:    SaO2: 97 RA 1025-1055 Assisted X 1 and used walker to ambulate. Gait steady with walker. Pt able to walk 890 feet without c/o. He denies any symptoms  difficulty with speech like  he had yesterday. Pt to bed after walk with call light in reach and wife present.  Rodney Langton RN 08/27/2013 10:54 AM

## 2013-08-28 MED ORDER — LIDOCAINE HCL (PF) 1 % IJ SOLN
2.0000 mL | Freq: Once | INTRAMUSCULAR | Status: AC
  Administered 2013-08-28: 2 mL via INTRADERMAL
  Filled 2013-08-28: qty 2

## 2013-08-28 MED ORDER — LISINOPRIL 5 MG PO TABS: 5.0000 mg | ORAL_TABLET | Freq: Every day | ORAL | Status: DC

## 2013-08-28 MED ORDER — FE FUMARATE-B12-VIT C-FA-IFC PO CAPS: 1.0000 | ORAL_CAPSULE | Freq: Every day | ORAL | Status: DC

## 2013-08-28 NOTE — Progress Notes (Signed)
After walking with rehab pts abd dressing became wet with dark blood, Donielle PA paged and evaluated pt at bedside, abd dressing changed, will continue to monitor Rickard Rhymes, RN

## 2013-08-28 NOTE — Progress Notes (Addendum)
      [address]       ,[place] [address]             [phone]        6 Days Post-Op Procedure(s) (LRB): REPLACEMENT ASCENDING AORTA with hemashield graft (N/A) INTRAOPERATIVE TRANSESOPHAGEAL ECHOCARDIOGRAM (N/A) CORONARY ARTERY BYPASS GRAFTING (CABG) TIMES ONE USING RIGHT THIGH GREATER SAPHENOUS VEIN HARVESTED ENDOSCOPICALLY (N/A)  Subjective: Patient without complaints this morning.  Objective: Vital signs in last 24 hours: Temp:  [98.3 F (36.8 C)-98.5 F (36.9 C)] 98.4 F (36.9 C) (05/13 0349) Pulse Rate:  [86-96] 96 (05/13 0349) Cardiac Rhythm:  [-] Normal sinus rhythm (05/12 1930) Resp:  [18-20] 18 (05/13 0349) BP: (120-134)/(84-94) 126/88 mmHg (05/13 0349) SpO2:  [96 %-97 %] 96 % (05/13 0349) Weight:  [171 lb 1.2 oz (77.6 kg)] 171 lb 1.2 oz (77.6 kg) (05/13 0349)  Pre op weight 77 kg Current Weight  08/28/13 171 lb 1.2 oz (77.6 kg)      Intake/Output from previous day: 05/12 0701 - 05/13 0700 In: 360 [P.O.:360] Out: 400 [Urine:400]   Physical Exam:  Cardiovascular: RRR Pulmonary: Clear Abdomen: Soft, non tender, bowel sounds present. Extremities: Trace bilateral lower extremity edema. Wounds: Clean and dry.  No erythema or signs of infection. Chest tube sites have dehisced and there is bloody oozing.  Neurologic: Grossly intact without focal deficits  Lab Results: CBC:  Recent Labs  08/26/13 0445 08/27/13 0453  WBC 7.4 7.1  HGB 8.5* 8.9*  HCT 25.7* 26.7*  PLT 208 258   BMET:   Recent Labs  08/26/13 0445 08/27/13 0453  NA 140 138  K 4.0 4.1  CL 102 104  CO2 26 25  GLUCOSE 94 91  BUN 9 8  CREATININE 0.50 0.43*  CALCIUM 8.4 8.6    PT/INR:  Lab Results  Component Value Date   INR 1.90* 08/22/2013   INR 1.01 08/20/2013   INR 1.1* 07/15/2013   ABG:  INR: Will add last result for INR, ABG once components are confirmed Will add last 4 CBG results once components are confirmed  Assessment/Plan:  1. CV - SR in  the 90's. On Lopressor 25 bid and Lisinopril 5 daily. 2.  Pulmonary - Encourage incentive spirometer 3. Volume Overload - On Lasix 40 daily 4.  Acute blood loss anemia - H and H stable at 8.9 and 26.7. Start Trinsicon 5.Neurologic-CT head done yesterday showed no acute intracranial abnormalities.. Carotid duplex showed no significant stenosis bilaterally. May have had a TIANo further facial droop or garbled speech.  6. Regarding chest tube sites, steri strips removed. Area cleaned with saline and dry 4x4s applied with tape. Patient to change dressing daily. 7. Discharge  Kayleen Alig M ZimmermanPA-C 08/28/2013,7:26 AM

## 2013-08-28 NOTE — Progress Notes (Signed)
Donielle PA notified of saturated abdominal gauze, Donielle at bedside evaluating pt and chest xray ordered, will continue to monitor pt, pt sitting in chair with wife at bedside Rickard Rhymes, [place]

## 2013-08-28 NOTE — Progress Notes (Addendum)
Right chest tube site has a fair amount of bloody drainage. I spoke with Dr. Prescott Gum. Appears to be a superficial skin bleed.  After prepping the right chest tube wound, I injected 1 % lidocaine. 3 silk sutures were placed in right chest tube site. No bloody drainage noted. Patient tolerated procedure well. Will ambulate patient. If no further bleeding, will discharge home.

## 2013-08-28 NOTE — Progress Notes (Signed)
CARDIAC REHAB PHASE I   PRE:  Rate/Rhythm: 76 SR  BP:  Sitting:       SaO2: 98 RA  MODE:  Ambulation: 350 ft   POST:  Rate/Rhythm: 80 SR  BP:  Sitting:     SaO2: 96 RA 1125-1210 Pt ambulated to BR and in hallway independently with steady gait. Denied complaints. Rehab RN asked to walk pt prior to d/c to determine if CT site would bleed with activity. Slight drainage noted post walk. Shadowing marked on dressing and primary RN notified. Eduction complete with wife and patient concentrating on sternal precautions. Pt has significant arthritis in his back and hips and has difficulty standing without using arms. Wife instructed on how to properly and safely help pt with standing and sitting. Pt also encouraged to call [id number]. Post walk pt to chair with call bell in reach and wife at bedside. Wife also states that he has a walker at home pt can use if needed.  Mercedez Boule English PayneRN, BSN 08/28/2013 12:33 PM

## 2013-08-28 NOTE — Progress Notes (Signed)
Pt ambulated in hallway with no assistance and no drainage on abdominal dressing Rickard Rhymes, RN

## 2013-08-28 NOTE — Progress Notes (Signed)
IV and tele discontinued, discharge education and medication reviewed with pt and wife, both stated understanding and that they had no questions, emphasized the importance of monitoring abdominal dressing for more bleeding, pt getting dressed Rickard Rhymes, RN

## 2013-08-28 NOTE — Progress Notes (Signed)
Patient walked two times. No longer with bleeding at chest tube site. Wife informed if he begins bleeding to call asap to be seen. Dry gauze and tape to be applied daily for a few days. Then may let open to air.

## 2013-08-28 NOTE — Progress Notes (Signed)
Chest tube sites cleansed, and redressed with steri-strips and dry gauze per Donielle PA Rickard Rhymes, RN

## 2013-08-29 ENCOUNTER — Ambulatory Visit (INDEPENDENT_AMBULATORY_CARE_PROVIDER_SITE_OTHER): Payer: Self-pay | Admitting: Surgical

## 2013-08-29 VITALS — BP 116/82 | HR 96 | Temp 98.0°F | Resp 20 | Ht 69.0 in | Wt 170.0 lb

## 2013-08-29 DIAGNOSIS — Z8679 Personal history of other diseases of the circulatory system: Secondary | ICD-10-CM

## 2013-08-29 DIAGNOSIS — I7121 Aneurysm of the ascending aorta, without rupture: Secondary | ICD-10-CM

## 2013-08-29 DIAGNOSIS — I712 Thoracic aortic aneurysm, without rupture, unspecified: Secondary | ICD-10-CM

## 2013-08-29 DIAGNOSIS — Z9889 Other specified postprocedural states: Secondary | ICD-10-CM

## 2013-08-29 DIAGNOSIS — Z951 Presence of aortocoronary bypass graft: Secondary | ICD-10-CM

## 2013-08-29 DIAGNOSIS — T8189XA Other complications of procedures, not elsewhere classified, initial encounter: Secondary | ICD-10-CM

## 2013-08-29 DIAGNOSIS — I2510 Atherosclerotic heart disease of native coronary artery without angina pectoris: Secondary | ICD-10-CM

## 2013-08-29 NOTE — Progress Notes (Signed)
      [address]       [place] [address]             [phone]     Asked to see patient for some bleeding at chest tube site. Appears to have small hematoma with old blood draining from site. Was re-sutured yesterday. This appears to be a self limiting process that should stop completely over next few days. He will change dressing prn. He is not on coumadin, only ASA and platelet count is normal. There is no signs of infection. He will see [place] again at his regularly scheduled appt.  John Giovanni, PA-C

## 2013-08-29 NOTE — Addendum Note (Signed)
Addendum created 08/29/13 0959 by Roberts Gaudy, MD   Modules edited: Anesthesia Attestations

## 2013-09-06 NOTE — Discharge Summary (Signed)
patient examined and medical record reviewed,agree with above note. Tharon Aquas Trigt 09/06/2013

## 2013-09-11 ENCOUNTER — Encounter (HOSPITAL_COMMUNITY): Payer: Self-pay | Admitting: Cardiothoracic Surgery

## 2013-09-16 ENCOUNTER — Ambulatory Visit (INDEPENDENT_AMBULATORY_CARE_PROVIDER_SITE_OTHER): Payer: Medicare Other | Admitting: Adult Health

## 2013-09-16 ENCOUNTER — Encounter: Payer: Self-pay | Admitting: Adult Health

## 2013-09-16 VITALS — BP 126/98 | HR 64 | Ht 69.0 in | Wt 171.0 lb

## 2013-09-16 DIAGNOSIS — F1010 Alcohol abuse, uncomplicated: Secondary | ICD-10-CM

## 2013-09-16 DIAGNOSIS — I712 Thoracic aortic aneurysm, without rupture, unspecified: Secondary | ICD-10-CM

## 2013-09-16 DIAGNOSIS — I10 Essential (primary) hypertension: Secondary | ICD-10-CM

## 2013-09-16 NOTE — Assessment & Plan Note (Signed)
States he quit drinking and is compliant.

## 2013-09-16 NOTE — Progress Notes (Deleted)
Name: Samuel Long    DOB: 06-Aug-1945  Age: 68 y.o.  MR#: [id number]       PCP:  Leonides Grills, MD      Insurance: Payor: [employer] MEDICARE / Plan: AARP MEDICARE COMPLETE / Product Type: *No Product type* /   CC:    Chief Complaint  Patient presents with  . AAA  . Coronary Artery Disease    VS Filed Vitals:   09/16/13 1454  BP: 126/98  Pulse: 64  Height: 5\' 9"  (1.753 m)  Weight: 171 lb (77.565 kg)  SpO2: 99%    Weights Current Weight  09/16/13 171 lb (77.565 kg)  08/29/13 170 lb (77.111 kg)  08/28/13 171 lb 1.2 oz (77.6 kg)    Blood Pressure  BP Readings from Last 3 Encounters:  09/16/13 126/98  08/29/13 116/82  08/28/13 129/84     Admit date:  (Not on file) Last encounter with RMR:  07/12/2013   Allergy Review of patient's allergies indicates no known allergies.  Current Outpatient Prescriptions  Medication Sig Dispense Refill  . aspirin 325 MG tablet Take 325 mg by mouth daily.      . budesonide-formoterol (SYMBICORT) 160-4.5 MCG/ACT inhaler Inhale 2 puffs into the lungs 2 (two) times daily.  1 Inhaler  1  . ferrous [id number] C-folic acid (TRINSICON / FOLTRIN) capsule Take 1 capsule by mouth daily with lunch. For one month then stop.  30 capsule  0  . gemfibrozil (LOPID) 600 MG tablet Take 600 mg by mouth 2 (two) times daily before a meal.      . HYDROcodone-acetaminophen (NORCO) 10-325 MG per tablet       . lisinopril (PRINIVIL,ZESTRIL) 5 MG tablet Take 1 tablet (5 mg total) by mouth daily.  30 tablet  1  . metoprolol (LOPRESSOR) 25 MG tablet Take 1 tablet (25 mg total) by mouth 2 (two) times daily.  60 tablet  1  . Multiple Vitamin (MULITIVITAMIN WITH MINERALS) TABS Take 1 tablet by mouth daily.      . nicotine (NICODERM CQ - DOSED IN MG/24 HOURS) 14 mg/24hr patch Place 1 patch (14 mg total) onto the skin daily.  28 patch  0  . potassium chloride 20 MEQ TBCR Take 20 mEq by mouth daily. For 4 days then stop.  4 tablet  0  . traMADol (ULTRAM)  50 MG tablet Take 1-2 tablets (50-100 mg total) by mouth every 6 (six) hours as needed.  30 tablet  0  . traZODone (DESYREL) 150 MG tablet Take 150 mg by mouth at bedtime.       No current facility-administered medications for this visit.    Discontinued Meds:    Medications Discontinued During This Encounter  Medication Reason  . furosemide (LASIX) 40 MG tablet Error    Patient Active Problem List   Diagnosis Date Noted  . Ankylosing spondylitis 12/14/2012  . Left ventricular hypertrophy 12/14/2012  . Essential hypertension, benign 12/14/2012  . Alcohol abuse 12/14/2012  . Thoracic aortic aneurysm 12/14/2012    LABS    Component Value Date/Time   NA 138 08/27/2013 0453   NA 140 08/26/2013 0445   NA 138 08/25/2013 0401   K 4.1 08/27/2013 0453   K 4.0 08/26/2013 0445   K 4.2 08/25/2013 0401   CL 104 08/27/2013 0453   CL 102 08/26/2013 0445   CL 102 08/25/2013 0401   CO2 25 08/27/2013 0453   CO2 26 08/26/2013 0445   CO2 26 08/25/2013 0401  GLUCOSE 91 08/27/2013 0453   GLUCOSE 94 08/26/2013 0445   GLUCOSE 93 08/25/2013 0401   BUN 8 08/27/2013 0453   BUN 9 08/26/2013 0445   BUN 13 08/25/2013 0401   CREATININE 0.43* 08/27/2013 0453   CREATININE 0.50 08/26/2013 0445   CREATININE 0.54 08/25/2013 0401   CALCIUM 8.6 08/27/2013 0453   CALCIUM 8.4 08/26/2013 0445   CALCIUM 8.4 08/25/2013 0401   GFRNONAA >90 08/27/2013 0453   GFRNONAA >90 08/26/2013 0445   GFRNONAA >90 08/25/2013 0401   GFRAA >90 08/27/2013 0453   GFRAA >90 08/26/2013 0445   GFRAA >90 08/25/2013 0401   CMP     Component Value Date/Time   NA 138 08/27/2013 0453   K 4.1 08/27/2013 0453   CL 104 08/27/2013 0453   CO2 25 08/27/2013 0453   GLUCOSE 91 08/27/2013 0453   BUN 8 08/27/2013 0453   CREATININE 0.43* 08/27/2013 0453   CALCIUM 8.6 08/27/2013 0453   PROT 5.9* 08/27/2013 0453   ALBUMIN 2.7* 08/27/2013 0453   AST 20 08/27/2013 0453   ALT 17 08/27/2013 0453   ALKPHOS 65 08/27/2013 0453   BILITOT 0.6 08/27/2013 0453   GFRNONAA >90  08/27/2013 0453   GFRAA >90 08/27/2013 0453       Component Value Date/Time   WBC 7.1 08/27/2013 0453   WBC 7.4 08/26/2013 0445   WBC 7.7 08/25/2013 0401   HGB 8.9* 08/27/2013 0453   HGB 8.5* 08/26/2013 0445   HGB 8.4* 08/25/2013 0401   HCT 26.7* 08/27/2013 0453   HCT 25.7* 08/26/2013 0445   HCT 24.9* 08/25/2013 0401   MCV 99.3 08/27/2013 0453   MCV 99.6 08/26/2013 0445   MCV 98.8 08/25/2013 0401    Lipid Panel  No results found for this basename: chol, trig, hdl, cholhdl, vldl, ldlcalc    ABG    Component Value Date/Time   PHART 7.446 08/24/2013 0415   PCO2ART 37.5 08/24/2013 0415   PO2ART 62.0* 08/24/2013 0415   HCO3 25.8* 08/24/2013 0415   TCO2 27 08/24/2013 0415   ACIDBASEDEF 0.4 08/20/2013 1513   O2SAT 92.0 08/24/2013 0415     No results found for this basename: TSH   BNP (last 3 results) No results found for this basename: PROBNP,  in the last 8760 hours Cardiac Panel (last 3 results) No results found for this basename: CKTOTAL, CKMB, TROPONINI, RELINDX,  in the last 72 hours  Iron/TIBC/Ferritin No results found for this basename: iron, tibc, ferritin     EKG Orders placed during the hospital encounter of 08/22/13  . EKG 12-LEAD  . EKG 12-LEAD  . EKG 12-LEAD  . EKG 12-LEAD  . EKG 12-LEAD  . EKG 12-LEAD     Prior Assessment and Plan Problem List as of 09/16/2013     Cardiovascular and Mediastinum   Left ventricular hypertrophy   Last Assessment & Plan   12/14/2012 Office Visit Written 12/14/2012 10:25 AM by Satira Sark, MD     Most likely hypertensive heart disease. An echocardiogram will be obtained to better define.    Essential hypertension, benign   Last Assessment & Plan   07/12/2013 Office Visit Written 07/12/2013  3:56 PM by Lendon Colonel, NP     Patient pressure is currently well-controlled on amlodipine 5 mg daily. Continue aspirin tablet daily. And lisinopril HCTZ, and metoprolol 50 mg twice a day. He will continue these medications prior cardiac  catheterization. Any changes in medication will be completed post procedure.  Thoracic aortic aneurysm   Last Assessment & Plan   07/12/2013 Office Visit Written 07/12/2013  3:55 PM by Lendon Colonel, NP     The patient is plan for cardiac catheterization with evaluation of coronary anatomy, and aortic aneurysm, prior to rescue her aneurysm repair. The patient is asymptomatic. I discussed risks benefits and details of the procedure. The patient's main concern is back pain due to his history of ankylosing spondylosis, and inability to lay flat on the cardiac catheter table for any length of time.  I will provide orders for medication, for pain control, to be given a cardiac catheterization. The patient verbalizes understanding of procedure, and is willing to proceed.      Musculoskeletal and Integument   Ankylosing spondylitis     Other   Alcohol abuse   Last Assessment & Plan   12/14/2012 Office Visit Written 12/14/2012 10:26 AM by Satira Sark, MD     Sounds like this will remain an active struggle for the patient and his wife. I recommended that he seek therapy for this locally, perhaps through behavioral health and other avenues.        Imaging: Dg Chest 2 View  08/27/2013   CLINICAL DATA:  Thoracic aortic aneurysm status post open repair and CABG  EXAM: CHEST  2 VIEW  COMPARISON:  Prior chest x-ray 08/25/2013  FINDINGS: Interval removal of central venous catheter. Stable cardiac and mediastinal contours. Patient is status post median sternotomy with evidence of multivessel CABG. Inspiratory volumes are improved. There is a small persistent left pleural effusion and associated left basilar atelectasis. Improving right pleural effusion and atelectasis. No focal airspace consolidation, edema or pneumothorax. No acute osseous abnormality.  IMPRESSION: 1. Small left and trace right layering pleural effusions with associated bibasilar atelectasis. 2. Interval removal of left IJ central  line. 3. No residual pneumothorax.   Electronically Signed   By: Jacqulynn Cadet M.D.   On: 08/27/2013 10:18   Dg Chest 2 View  08/20/2013   CLINICAL DATA:  Thoracic aortic aneurysm. Pre operative respiratory exam.  EXAM: CHEST  2 VIEW  COMPARISON:  Chest x-ray dated 05/23/2006 and chest CT dated 08/20/2013  FINDINGS: There is tortuosity of the thoracic aorta. There is borderline cardiomegaly. There is diffuse slight accentuation of the interstitial markings. No infiltrates or effusions. Nipple shadows are seen overlying both lower lung zones. Slight accentuation of upper thoracic kyphosis. No acute osseous abnormality.  IMPRESSION: No acute abnormality.  Borderline cardiomegaly.   Electronically Signed   By: Rozetta Nunnery M.D.   On: 08/20/2013 16:15   Ct Head Wo Contrast  08/26/2013   CLINICAL DATA:  Slurred speech, history benign essential hypertension, coronary artery disease, COPD  EXAM: CT HEAD WITHOUT CONTRAST  TECHNIQUE: Contiguous axial images were obtained from the base of the skull through the vertex without intravenous contrast.  COMPARISON:  None  FINDINGS: Scattered beam hardening artifacts of dental origin.  Generalized atrophy.  Normal ventricular morphology.  No midline shift or mass effect.  Otherwise normal appearance of brain parenchyma.  No intracranial hemorrhage, mass lesion or evidence acute infarction.  No extra-axial fluid collections.  Air-fluid levels and mucosal thickening RIGHT maxillary sinus and LEFT sphenoid sinus.  No acute osseous findings.  IMPRESSION: No acute intracranial abnormalities.  Sinus disease as above.   Electronically Signed   By: Lavonia Dana M.D.   On: 08/26/2013 13:12   Dg Chest Port 1 View  08/25/2013   CLINICAL DATA:  Postop  from CABG and ascending aortic aneurysm repair.  EXAM: PORTABLE CHEST - 1 VIEW  COMPARISON:  08/24/2013  FINDINGS: Left jugular central venous catheter remains in appropriate position. Right jugular Cordis is been removed. A tiny less  than 5% right apical pneumothorax appears decreased in size compared to prior study.  Small right pleural effusion is stable. Left retrocardiac atelectasis or consolidation is unchanged. Cardiomegaly is stable. No evidence of congestive heart failure.  IMPRESSION: Decrease in size of tiny less than 5% right apical pneumothorax.  Stable small right pleural effusion and left retrocardiac atelectasis versus consolidation.   Electronically Signed   By: Earle Gell M.D.   On: 08/25/2013 10:29   Dg Chest Port 1 View  08/24/2013   CLINICAL DATA:  Postop cardiac surgery  EXAM: PORTABLE CHEST - 1 VIEW  COMPARISON:  DG CHEST 1V PORT dated 08/23/2013; DG CHEST 1V PORT dated 08/22/2013; DG CHEST 2 VIEW dated 08/20/2013  FINDINGS: Stable cardiac enlargement. Swan-Ganz catheter has been removed with right vascular sheath in its place. Left central line unchanged. NG tube has been removed as has endotracheal tube. There remains a mediastinal drain.  Mild peribronchial thickening bilaterally. Mild vascular congestion. Small bilateral pleural effusions with bibasilar atelectasis.  There is a stable 12 mm right pneumothorax.  IMPRESSION: Mild postoperative vascular congestion with bilateral effusions and lower lobe atelectasis, stable.  Stable small right pneumothorax. Critical Value/emergent results were called by telephone at the time of interpretation on 08/24/2013 at 10:24 AM to [facility], the patient's nurse, who verbally acknowledged these results.   Electronically Signed   By: Skipper Cliche M.D.   On: 08/24/2013 10:24   Dg Chest Portable 1 View In Am  08/23/2013   CLINICAL DATA:  CABG.  EXAM: PORTABLE CHEST - 1 VIEW  COMPARISON:  DG CHEST 1V PORT dated 08/22/2013  FINDINGS: Endotracheal tube, NG tube, left IJ line, Swan-Ganz catheter, mediastinal drains catheter stable position. Prior CABG. Stable cardiomegaly with normal pulmonary vascularity. Previously identified pulmonary venous congestion has cleared. Small bilateral pleural  effusions. Basilar atelectasis. Previously identified right upper lobe atelectasis has almost completely cleared.  IMPRESSION: 1. Stable line and tube positions. 2. Interval near complete clearing of right upper lobe atelectasis. 3. Basilar atelectasis. Left lower lobe infiltrate cannot be excluded. Small bilateral pleural effusions are present. 4. Prior CABG. Cardiomegaly. Pulmonary venous congestion has improved.   Electronically Signed   By: Marcello Moores  Register   On: 08/23/2013 08:03   Dg Chest Portable 1 View  08/22/2013   CLINICAL DATA:  Postop.  EXAM: PORTABLE CHEST - 1 VIEW  COMPARISON:  08/20/2013  FINDINGS: Sternotomy wires present. Endotracheal tube has tip 3.6 cm above the carina. Nasogastric tube courses into the region of the stomach where it is looped once and travels off the inferior portion of the film with tip not visualized. Mediastinal drain is in place. Left IJ central venous catheter has tip obliquely oriented over the region of the SVC. Right IJ Swan-Ganz catheter has tip over the right main pulmonary artery. There is a faint catheter noted with tip just left of left of midline at the level of the aortic arch.  Lungs are adequately inflated with opacification the left at left base likely effusions/atelectasis. There is opacification over the right upper lobe likely atelectasis with mild bilateral upper paramediastinal density. No evidence of pneumothorax. Mild cardiomegaly with minimal prominence of the perihilar markings.  IMPRESSION: Left base opacification likely effusion/atelectasis. Opacification over the right upper lobe likely due to  atelectasis. Possible mild vascular congestion.  Tubes and lines as described.   Electronically Signed   By: Marin Olp M.D.   On: 08/22/2013 17:03   Ct Angio Chest Aorta W/cm &/or Wo/cm  08/20/2013   CLINICAL DATA:  Thoracic aneurysm, preop planning  EXAM: CT ANGIOGRAPHY CHEST WITH CONTRAST  TECHNIQUE: Multidetector CT imaging of the chest was performed  using the standard protocol during bolus administration of intravenous contrast. Multiplanar CT image reconstructions and MIPs were obtained to evaluate the vascular anatomy.  CONTRAST:  157mL OMNIPAQUE IOHEXOL 350 MG/ML SOLN  COMPARISON:  03/26/2013  FINDINGS: The noncontrast scout shows scattered coronary and aortic calcifications. No hyperdense crescent, mediastinal hematoma, pleural or pericardial effusion.  On CTA, thoracic aorta measures 3.6 cm at the level of sino-tubular junction, 4.8 cm proximal ascending (previously 4.9 cm), 4.7 cm distal ascending, 4.2 cm proximal arch, 3.2 cm distal arch, 3.5 cm proximal descending, tapering to 2.8 cm distal descending just above the diaphragm. There is no dissection or stenosis. Brachiocephalic arterial origins are widely patent. The left vertebral artery originates directly from the aortic arch, a common anatomic variant. There is fairly good contrast opacification of pulmonary artery branches; the exam was not optimized for detection of pulmonary emboli.  No pleural or pericardial effusion. There is mild dependent atelectasis posteriorly in the right upper lobe and both lower lobes. 5 mm nodule adjacent to the right minor fissure, previously 4 mm. Lungs are otherwise clear. Visualized portions of upper abdomen unremarkable. Ankylosis in the lower cervical and mid thoracic spine, with Sclerotic changes in several contiguous mid thoracic vertebral bodies stable since prior study.  Review of the MIP images confirms the above findings.  IMPRESSION: 1. Stable 4.8 cm ascending aortic aneurysm without complicating features.   Electronically Signed   By: Arne Cleveland M.D.   On: 08/20/2013 14:54

## 2013-09-16 NOTE — Progress Notes (Signed)
HPI: Samuel Long is a 68 year old patient of Dr. Domenic Polite we are following for ongoing assessment and management of AAA, status post thoracic aortic aneurysm repair 2015, CAD, status post cardiac catheterization in April of 2015. Right and left heart catheterization by Dr. Martinique demonstrated no significant coronary artery disease with mild 50% stenosis at the posterior lateral right coronary artery. Right cardiac catheterization revealed normal pressures with a cardiac index of 2.2 and mixed venous saturation of 60%.  Thoracic aortic aneurysm repair was completed on 08/22/2013 by Dr. Prescott Gum the patient was found to have some mild blood loss anemia which was not requirement of transfusion and was started on iron. He is here for post hospitalization followup.  He is doing very well. He has stopped smoking, he is medically compliant, using incentive spirometer and walking 20 minutes twice a day. He is refusing cardiac rehab.  No Known Allergies  Current Outpatient Prescriptions  Medication Sig Dispense Refill  . aspirin 325 MG tablet Take 325 mg by mouth daily.      . budesonide-formoterol (SYMBICORT) 160-4.5 MCG/ACT inhaler Inhale 2 puffs into the lungs 2 (two) times daily.  1 Inhaler  1  . ferrous [id number] C-folic acid (TRINSICON / FOLTRIN) capsule Take 1 capsule by mouth daily with lunch. For one month then stop.  30 capsule  0  . gemfibrozil (LOPID) 600 MG tablet Take 600 mg by mouth 2 (two) times daily before a meal.      . HYDROcodone-acetaminophen (NORCO) 10-325 MG per tablet       . lisinopril (PRINIVIL,ZESTRIL) 5 MG tablet Take 1 tablet (5 mg total) by mouth daily.  30 tablet  1  . metoprolol (LOPRESSOR) 25 MG tablet Take 1 tablet (25 mg total) by mouth 2 (two) times daily.  60 tablet  1  . Multiple Vitamin (MULITIVITAMIN WITH MINERALS) TABS Take 1 tablet by mouth daily.      . nicotine (NICODERM CQ - DOSED IN MG/24 HOURS) 14 mg/24hr patch Place 1 patch (14 mg total) onto  the skin daily.  28 patch  0  . potassium chloride 20 MEQ TBCR Take 20 mEq by mouth daily. For 4 days then stop.  4 tablet  0  . traMADol (ULTRAM) 50 MG tablet Take 1-2 tablets (50-100 mg total) by mouth every 6 (six) hours as needed.  30 tablet  0  . traZODone (DESYREL) 150 MG tablet Take 150 mg by mouth at bedtime.       No current facility-administered medications for this visit.    Past Medical History  Diagnosis Date  . Spondylitis, ankylosing   . Essential hypertension, benign   . BPH (benign prostatic hyperplasia)   . Depression   . PTSD (post-traumatic stress disorder)   . Cataracts, bilateral   . Left rib fracture   . Coronary artery disease   . COPD (chronic obstructive pulmonary disease)     mild  . Arthritis   . Thoracic aortic aneurysm     4.7 x 4.5 cm  April 2013  . AAA (abdominal aortic aneurysm)     hx of TAA, but no AAA identified on ultrasound 12/29/11    Past Surgical History  Procedure Laterality Date  . Bone cyst excision      Left foot, [name]  . Transurethral resection of prostate  06/16/2011    Procedure: TRANSURETHRAL RESECTION OF THE PROSTATE (TURP);  Surgeon: Marissa Nestle, MD;  Location: [facility];  Service: Urology;  Laterality: N/A;  .  Replacement ascending aorta N/A 08/22/2013    Procedure: REPLACEMENT ASCENDING AORTA with hemashield graft;  Surgeon: Ivin Poot, MD;  Location: [facility];  Service: Open Heart Surgery;  Laterality: N/A;  . Intraoperative transesophageal echocardiogram N/A 08/22/2013    Procedure: INTRAOPERATIVE TRANSESOPHAGEAL ECHOCARDIOGRAM;  Surgeon: Ivin Poot, MD;  Location: [facility];  Service: Open Heart Surgery;  Laterality: N/A;  . Coronary artery bypass graft N/A 08/22/2013    Procedure: CORONARY ARTERY BYPASS GRAFTING (CABG) TIMES ONE USING RIGHT THIGH GREATER SAPHENOUS VEIN HARVESTED ENDOSCOPICALLY;  Surgeon: Ivin Poot, MD;  Location: [facility];  Service: Open Heart Surgery;  Laterality: N/A;    ROS:  Review of systems  complete and found to be negative unless listed above  PHYSICAL EXAM BP 126/98  Pulse 64  Ht 5\' 9"  (1.753 m)  Wt 171 lb (77.565 kg)  BMI 25.24 kg/m2  SpO2 99% Review of systems complete and found to be negative unless listed above  General: Well developed, well nourished, in no acute distress Head: Eyes PERRLA, No xanthomas.   Normal cephalic and atramatic  Lungs: Clear bilaterally to auscultation and percussion. Heart: HRRR S1 S2, without MRG.  Pulses are 2+ & equal.            No carotid bruit. No JVD.  No abdominal bruits. No femoral bruits. Abdomen: Bowel sounds are positive, abdomen soft and non-tender without masses or                  Hernia's noted. Msk:  Back normal, normal gait. Sternotomy scar is well healed no bleeding Sutures to mid abdomin with no signs of infection. Normal strength and tone for age. Extremities: No clubbing, cyanosis or edema.  DP +1 Neuro: Alert and oriented X 3. Psych:  Good affect, responds appropriately   ASSESSMENT AND PLAN

## 2013-09-16 NOTE — Assessment & Plan Note (Signed)
BP is well controlled No changes in medication regimen.

## 2013-09-16 NOTE — Assessment & Plan Note (Addendum)
Healing well. He has stopped smoking and is walking 20 minutes twice a day. He is without complaints. BP is well controlled. No changes in medications regimen.He is due to see Dr.VanTright on June 10th for follow up.

## 2013-09-16 NOTE — Patient Instructions (Signed)
Your physician recommends that you schedule a follow-up appointment in: 3 months with Dr Ferne Reus will receive a reminder letter two months in advance reminding you to call and schedule your appointment. If you don't receive this letter, please contact our office.  Your physician recommends that you continue on your current medications as directed. Please refer to the Current Medication list given to you today.

## 2013-09-25 ENCOUNTER — Ambulatory Visit (INDEPENDENT_AMBULATORY_CARE_PROVIDER_SITE_OTHER): Payer: Self-pay | Admitting: Cardiothoracic Surgery

## 2013-09-25 ENCOUNTER — Ambulatory Visit
Admission: RE | Admit: 2013-09-25 | Discharge: 2013-09-25 | Disposition: A | Discharge status: Home / Self Care | Payer: Medicare Other | Source: Ambulatory Visit | Attending: Cardiothoracic Surgery | Admitting: Cardiothoracic Surgery

## 2013-09-25 ENCOUNTER — Other Ambulatory Visit: Payer: Self-pay | Admitting: Cardiothoracic Surgery

## 2013-09-25 ENCOUNTER — Encounter: Payer: Self-pay | Admitting: Cardiothoracic Surgery

## 2013-09-25 VITALS — BP 149/90 | HR 55 | Resp 16 | Ht 69.0 in | Wt 171.0 lb

## 2013-09-25 DIAGNOSIS — I712 Thoracic aortic aneurysm, without rupture, unspecified: Secondary | ICD-10-CM

## 2013-09-25 DIAGNOSIS — Z9889 Other specified postprocedural states: Secondary | ICD-10-CM

## 2013-09-25 DIAGNOSIS — I2510 Atherosclerotic heart disease of native coronary artery without angina pectoris: Secondary | ICD-10-CM

## 2013-09-25 DIAGNOSIS — I7121 Aneurysm of the ascending aorta, without rupture: Secondary | ICD-10-CM

## 2013-09-25 DIAGNOSIS — Z951 Presence of aortocoronary bypass graft: Secondary | ICD-10-CM

## 2013-09-25 DIAGNOSIS — Z8679 Personal history of other diseases of the circulatory system: Secondary | ICD-10-CM

## 2013-09-26 NOTE — Progress Notes (Signed)
PCP is Leonides Grills, MD Referring Provider is [place], Rissie Sculley M, MD  Chief Complaint  Patient presents with  . Routine Post Op    1 month f/u with CXR, S/P Ascending aortic repair and CABG x 1 on 08/24/13     HPI: The patient returns for her first postop visit after resection and grafting of a ascending aortic fusiform aneurysm using circulatory arrest and preservation of the aortic valve. The patient is done well at home. He has successfully stopped smoking for which he is commended. The patient is ready to start walking more and has a path marked out around his property of about 1/2 mile.  His incisions are healed he has no symptoms of CHF. He is drinking 2 beers daily only. He is given a partial refill for his hydrocodone prescription for pain. He will be checking with his pain clinic physician, Dr. Nicholaus Bloom for his regular monthly pain prescription which should replace the pain medication he has received following surgery and eliminate the need for further prescriptions from this office  Past Medical History  Diagnosis Date  . Spondylitis, ankylosing   . Essential hypertension, benign   . BPH (benign prostatic hyperplasia)   . Depression   . PTSD (post-traumatic stress disorder)   . Cataracts, bilateral   . Left rib fracture   . Coronary artery disease   . COPD (chronic obstructive pulmonary disease)     mild  . Arthritis   . Thoracic aortic aneurysm     4.7 x 4.5 cm  April 2013  . AAA (abdominal aortic aneurysm)     hx of TAA, but no AAA identified on ultrasound 12/29/11    Past Surgical History  Procedure Laterality Date  . Bone cyst excision      Left foot, [name]  . Transurethral resection of prostate  06/16/2011    Procedure: TRANSURETHRAL RESECTION OF THE PROSTATE (TURP);  Surgeon: Marissa Nestle, MD;  Location: [facility];  Service: Urology;  Laterality: N/A;  . Replacement ascending aorta N/A 08/22/2013    Procedure: REPLACEMENT ASCENDING AORTA with hemashield  graft;  Surgeon: Ivin Poot, MD;  Location: [place];  Service: Open Heart Surgery;  Laterality: N/A;  . Intraoperative transesophageal echocardiogram N/A 08/22/2013    Procedure: INTRAOPERATIVE TRANSESOPHAGEAL ECHOCARDIOGRAM;  Surgeon: Ivin Poot, MD;  Location: [place];  Service: Open Heart Surgery;  Laterality: N/A;  . Coronary artery bypass graft N/A 08/22/2013    Procedure: CORONARY ARTERY BYPASS GRAFTING (CABG) TIMES ONE USING RIGHT THIGH GREATER SAPHENOUS VEIN HARVESTED ENDOSCOPICALLY;  Surgeon: Ivin Poot, MD;  Location: [place];  Service: Open Heart Surgery;  Laterality: N/A;    Family History  Problem Relation Age of Onset  . Anesthesia problems Neg Hx   . Hypotension Neg Hx   . Malignant hyperthermia Neg Hx   . Pseudochol deficiency Neg Hx   . Heart failure Mother   . Heart failure Father   . Heart disease Father   . Heart disease Paternal Uncle     Social History History  Substance Use Topics  . Smoking status: Current Every Day Smoker -- 1.00 packs/day for 40 years    Types: Cigarettes  . Smokeless tobacco: Never Used  . Alcohol Use: 1.8 oz/week    1 Glasses of wine, 2 Cans of beer per week     Comment: 2-4 beers daily    Current Outpatient Prescriptions  Medication Sig Dispense Refill  . aspirin 325 MG tablet Take 325  mg by mouth daily.      . budesonide-formoterol (SYMBICORT) 160-4.5 MCG/ACT inhaler Inhale 2 puffs into the lungs 2 (two) times daily.  1 Inhaler  1  . ferrous [id number] C-folic acid (TRINSICON / FOLTRIN) capsule Take 1 capsule by mouth daily with lunch. For one month then stop.  30 capsule  0  . gemfibrozil (LOPID) 600 MG tablet Take 600 mg by mouth 2 (two) times daily before a meal.      . HYDROcodone-acetaminophen (NORCO) 10-325 MG per tablet       . lisinopril (PRINIVIL,ZESTRIL) 5 MG tablet Take 1 tablet (5 mg total) by mouth daily.  30 tablet  1  . metoprolol (LOPRESSOR) 25 MG tablet Take 1 tablet (25 mg total) by mouth 2 (two)  times daily.  60 tablet  1  . Multiple Vitamin (MULITIVITAMIN WITH MINERALS) TABS Take 1 tablet by mouth daily.      . nicotine (NICODERM CQ - DOSED IN MG/24 HOURS) 14 mg/24hr patch Place 1 patch (14 mg total) onto the skin daily.  28 patch  0  . traZODone (DESYREL) 150 MG tablet Take 150 mg by mouth at bedtime.       No current facility-administered medications for this visit.    No Known Allergies  Review of Systems patient has lost some weight since surgery His appetite is good his sleeping pattern is improved He denies edema  BP 149/90  Pulse 55  Resp 16  Ht 5\' 9"  (1.753 m)  Wt 171 lb (77.565 kg)  BMI 25.24 kg/m2  SpO2 97% Physical Exam Alert and comfortable Lungs clear Heart rate regular No cardiac murmur No pedal edema Sternal incision and right axillary incision well-healed  Diagnostic Tests: Chest x-ray clear  Impression: Return for followup 3 months postop to discuss increased activity level The patient knows he may start driving now and lift up to 10 pounds. He knows he should walk 20 minutes daily.  Plan: Return in 3 months with chest x-ray

## 2013-10-11 NOTE — OR Nursing (Signed)
Addendum to scope page and wound class.  

## 2013-10-24 ENCOUNTER — Encounter (HOSPITAL_COMMUNITY): Payer: Medicare Other

## 2013-11-25 ENCOUNTER — Other Ambulatory Visit: Payer: Self-pay | Admitting: Cardiothoracic Surgery

## 2013-11-25 DIAGNOSIS — I712 Thoracic aortic aneurysm, without rupture, unspecified: Secondary | ICD-10-CM

## 2013-11-27 ENCOUNTER — Ambulatory Visit (INDEPENDENT_AMBULATORY_CARE_PROVIDER_SITE_OTHER): Payer: Medicare Other | Admitting: Cardiothoracic Surgery

## 2013-11-27 ENCOUNTER — Encounter: Payer: Self-pay | Admitting: Cardiothoracic Surgery

## 2013-11-27 ENCOUNTER — Ambulatory Visit
Admission: RE | Admit: 2013-11-27 | Discharge: 2013-11-27 | Disposition: A | Discharge status: Home / Self Care | Payer: Medicare Other | Source: Ambulatory Visit | Attending: Cardiothoracic Surgery | Admitting: Cardiothoracic Surgery

## 2013-11-27 VITALS — BP 104/90 | HR 60 | Ht 69.0 in | Wt 171.0 lb

## 2013-11-27 DIAGNOSIS — Z8679 Personal history of other diseases of the circulatory system: Secondary | ICD-10-CM

## 2013-11-27 DIAGNOSIS — Z9889 Other specified postprocedural states: Principal | ICD-10-CM

## 2013-11-27 DIAGNOSIS — I712 Thoracic aortic aneurysm, without rupture, unspecified: Secondary | ICD-10-CM

## 2013-11-27 NOTE — Progress Notes (Signed)
PCP is Leonides Grills, MD Referring Provider is [place], Peter M, MD  Chief Complaint  Patient presents with  . F/U THORACIC    2 MO F/U with CXR  . Routine Post Op    HPI: The patient returns for 3 month followup after replacement of the ascending thoracic aorta for a fusiform aneurysm and combined CABG. The patient has had a good recovery. He has underlying COPD, ankylosing spondylitis and chronic pain syndrome. He has had no symptoms of heart failure or angina postop,he is increasing his activity level and is interested in resuming yard.. Maintained sinus rhythm.  Unfortunately has resumed smoking 2-3 cigarettes daily.   Past Medical History  Diagnosis Date  . Spondylitis, ankylosing   . Essential hypertension, benign   . BPH (benign prostatic hyperplasia)   . Depression   . PTSD (post-traumatic stress disorder)   . Cataracts, bilateral   . Left rib fracture   . Coronary artery disease   . COPD (chronic obstructive pulmonary disease)     mild  . Arthritis   . Thoracic aortic aneurysm     4.7 x 4.5 cm  April 2013  . AAA (abdominal aortic aneurysm)     hx of TAA, but no AAA identified on ultrasound 12/29/11    Past Surgical History  Procedure Laterality Date  . Bone cyst excision      Left foot, [name]  . Transurethral resection of prostate  06/16/2011    Procedure: TRANSURETHRAL RESECTION OF THE PROSTATE (TURP);  Surgeon: Marissa Nestle, MD;  Location: [facility];  Service: Urology;  Laterality: N/A;  . Replacement ascending aorta N/A 08/22/2013    Procedure: REPLACEMENT ASCENDING AORTA with hemashield graft;  Surgeon: Ivin Poot, MD;  Location: [place];  Service: Open Heart Surgery;  Laterality: N/A;  . Intraoperative transesophageal echocardiogram N/A 08/22/2013    Procedure: INTRAOPERATIVE TRANSESOPHAGEAL ECHOCARDIOGRAM;  Surgeon: Ivin Poot, MD;  Location: [place];  Service: Open Heart Surgery;  Laterality: N/A;  . Coronary artery bypass graft N/A 08/22/2013   Procedure: CORONARY ARTERY BYPASS GRAFTING (CABG) TIMES ONE USING RIGHT THIGH GREATER SAPHENOUS VEIN HARVESTED ENDOSCOPICALLY;  Surgeon: Ivin Poot, MD;  Location: [place];  Service: Open Heart Surgery;  Laterality: N/A;    Family History  Problem Relation Age of Onset  . Anesthesia problems Neg Hx   . Hypotension Neg Hx   . Malignant hyperthermia Neg Hx   . Pseudochol deficiency Neg Hx   . Heart failure Mother   . Heart failure Father   . Heart disease Father   . Heart disease Paternal Uncle     Social History History  Substance Use Topics  . Smoking status: Current Every Day Smoker -- 1.00 packs/day for 40 years    Types: Cigarettes  . Smokeless tobacco: Never Used  . Alcohol Use: 1.8 oz/week    1 Glasses of wine, 2 Cans of beer per week     Comment: 2-4 beers daily    Current Outpatient Prescriptions  Medication Sig Dispense Refill  . aspirin 325 MG tablet Take 325 mg by mouth daily.      Marland Kitchen gemfibrozil (LOPID) 600 MG tablet Take 600 mg by mouth 2 (two) times daily before a meal.      . lisinopril (PRINIVIL,ZESTRIL) 5 MG tablet Take 1 tablet (5 mg total) by mouth daily.  30 tablet  1  . metoprolol (LOPRESSOR) 25 MG tablet Take 1 tablet (25 mg total) by mouth 2 (two) times daily.  60 tablet  1  . MORPHINE SULFATE, CONCENTRATE, PO Take 30 mg by mouth 2 (two) times daily.      . Multiple Vitamin (MULITIVITAMIN WITH MINERALS) TABS Take 1 tablet by mouth daily.      . traZODone (DESYREL) 150 MG tablet Take 150 mg by mouth at bedtime.      . budesonide-formoterol (SYMBICORT) 160-4.5 MCG/ACT inhaler Inhale 2 puffs into the lungs 2 (two) times daily.  1 Inhaler  1  . ferrous [id number] C-folic acid (TRINSICON / FOLTRIN) capsule Take 1 capsule by mouth daily with lunch. For one month then stop.  30 capsule  0  . HYDROcodone-acetaminophen (NORCO) 10-325 MG per tablet       . nicotine (NICODERM CQ - DOSED IN MG/24 HOURS) 14 mg/24hr patch Place 1 patch (14 mg total) onto the  skin daily.  28 patch  0   No current facility-administered medications for this visit.    Allergies  Allergen Reactions  . Indocin [Indomethacin] Other (See Comments)    Migrane    Review of Systems patient made good recovery from thoracic aneurysm surgery with circulatory arrest  BP 104/90  Pulse 60  Ht 5\' 9"  (1.753 m)  Wt 171 lb (77.565 kg)  BMI 25.24 kg/m2  SpO2 96% Physical Exam Alert and comfortable Well-healed chest incisions Heart rate regular without murmur Lungs clear No peripheral edema  Diagnostic Tests: Chest x-ray reviewed showing stable cardiac silhouette and COPD  Impression: Excellent recovery 3 months postop ascending thoracic aneurysm repair with combined CABG  Plan: He'll return in one year postop for a CTA of the thoracic aorta to assess thoracic aortic disease  Smoking cessation stressed the patient Increased activity levels reviewed with patient including yard work

## 2013-12-30 ENCOUNTER — Encounter: Payer: Self-pay | Admitting: Cardiology

## 2013-12-30 ENCOUNTER — Ambulatory Visit (INDEPENDENT_AMBULATORY_CARE_PROVIDER_SITE_OTHER): Payer: Medicare Other | Admitting: Cardiology

## 2013-12-30 VITALS — BP 118/82 | HR 60 | Ht 69.0 in | Wt 162.0 lb

## 2013-12-30 DIAGNOSIS — I712 Thoracic aortic aneurysm, without rupture, unspecified: Secondary | ICD-10-CM

## 2013-12-30 DIAGNOSIS — I10 Essential (primary) hypertension: Secondary | ICD-10-CM

## 2013-12-30 DIAGNOSIS — I2510 Atherosclerotic heart disease of native coronary artery without angina pectoris: Secondary | ICD-10-CM

## 2013-12-30 NOTE — Progress Notes (Signed)
Clinical Summary Samuel Long is a 68 y.o.male last seen in June of this year by Ms. Lawrence NP. Patient underwent resection and grafting of a fusiform 5.2 cm ascending aortic aneurysm with a 30-mm Hemashield graft with preservation of the aortic valve, also associated with SVG to RCA in May of this year with Dr. Prescott Gum. He had a recent followup visit with TCTS in August, doing well.  He is here with his wife. Has been doing very well postoperatively. No chest pain or unusual shortness of breath. Doing some light yard work. He has had a more chronic problem with decreased appetite and seems to attribute this to depression. He is now on Zoloft.  Preoperative carotid Dopplers back in May showed 1-39% bilateral ICA stenoses. Echocardiogram from May reported LVEF 60-65% with grade 1 diastolic dysfunction, trivial aortic regurgitation.   Allergies  Allergen Reactions  . Indocin [Indomethacin] Other (See Comments)    Migrane    Current Outpatient Prescriptions  Medication Sig Dispense Refill  . aspirin 325 MG tablet Take 325 mg by mouth daily.      Marland Kitchen gemfibrozil (LOPID) 600 MG tablet Take 600 mg by mouth 2 (two) times daily before a meal.      . lisinopril (PRINIVIL,ZESTRIL) 5 MG tablet Take 1 tablet (5 mg total) by mouth daily.  30 tablet  1  . metoprolol (LOPRESSOR) 25 MG tablet Take 1 tablet (25 mg total) by mouth 2 (two) times daily.  60 tablet  1  . MORPHINE SULFATE, CONCENTRATE, PO Take 30 mg by mouth 2 (two) times daily.      . Multiple Vitamin (MULITIVITAMIN WITH MINERALS) TABS Take 1 tablet by mouth daily.      . sertraline (ZOLOFT) 100 MG tablet Take 100 mg by mouth daily.      . traZODone (DESYREL) 150 MG tablet Take 150 mg by mouth at bedtime.       No current facility-administered medications for this visit.    Past Medical History  Diagnosis Date  . Spondylitis, ankylosing   . Essential hypertension, benign   . BPH (benign prostatic hyperplasia)   . Depression   .  PTSD (post-traumatic stress disorder)   . Cataracts, bilateral   . Left rib fracture   . Coronary atherosclerosis of native coronary artery     Single-vessel status post SVG to RCA May 2015  . COPD (chronic obstructive pulmonary disease)   . Arthritis   . Thoracic aortic aneurysm     Status post repair May 2015    Past Surgical History  Procedure Laterality Date  . Bone cyst excision      Left foot, [name]  . Transurethral resection of prostate  06/16/2011    Procedure: TRANSURETHRAL RESECTION OF THE PROSTATE (TURP);  Surgeon: Marissa Nestle, MD;  Location: [facility];  Service: Urology;  Laterality: N/A;  . Replacement ascending aorta N/A 08/22/2013    Procedure: REPLACEMENT ASCENDING AORTA with hemashield graft;  Surgeon: Ivin Poot, MD;  Location: [place];  Service: Open Heart Surgery;  Laterality: N/A;  . Intraoperative transesophageal echocardiogram N/A 08/22/2013    Procedure: INTRAOPERATIVE TRANSESOPHAGEAL ECHOCARDIOGRAM;  Surgeon: Ivin Poot, MD;  Location: [place];  Service: Open Heart Surgery;  Laterality: N/A;  . Coronary artery bypass graft N/A 08/22/2013    Procedure: CORONARY ARTERY BYPASS GRAFTING (CABG) TIMES ONE USING RIGHT THIGH GREATER SAPHENOUS VEIN HARVESTED ENDOSCOPICALLY;  Surgeon: Ivin Poot, MD;  Location: [place];  Service: Open Heart  Surgery;  Laterality: N/A;    Family History  Problem Relation Age of Onset  . Anesthesia problems Neg Hx   . Hypotension Neg Hx   . Malignant hyperthermia Neg Hx   . Pseudochol deficiency Neg Hx   . Heart failure Mother   . Heart failure Father   . Heart disease Father   . Heart disease Paternal Uncle     Social History Samuel Long reports that he has been smoking Cigarettes.  He has a 40 pack-year smoking history. He has never used smokeless tobacco. Samuel Long reports that he drinks about 1.8 ounces of alcohol per week.  Review of Systems Chronic arthritic pains, back pains. Other systems reviewed and negative except  as outlined.  Physical Examination Filed Vitals:   12/30/13 1355  BP: 118/82  Pulse: 60   Filed Weights   12/30/13 1355  Weight: 162 lb (73.483 kg)    Appears comfortable at rest.  HEENT: Conjunctiva and lids normal, oropharynx clear.  Neck: Supple, no elevated JVP or carotid bruits, no thyromegaly.  Lungs: Clear to auscultation, nonlabored breathing at rest.  Hemathorax: Well-healed sternal incision. Cardiac: Regular rate and rhythm, soft S4, no S3 or significant systolic murmur, no pericardial rub.  Abdomen: Soft, nontender, bowel sounds present, no guarding or rebound.  Extremities: No pitting edema, distal pulses 2+.  Skin: Warm and dry.  Musculoskeletal: No kyphosis.  Neuropsychiatric: Alert and oriented x3, affect grossly appropriate.   Problem List and Plan   Coronary atherosclerosis of native coronary artery Single-vessel, status post SVG to RCA in may. Symptomatically stable. Continues on aspirin, beta blocker, and ACE inhibitor.  Thoracic aortic aneurysm Status post resection and grafting with a 30 mm Hemashield graft in May by Dr. Prescott Gum. Doing well postoperatively.  Essential hypertension, benign Blood pressure is normal today.    Satira Sark, M.D., F.A.C.C.

## 2013-12-30 NOTE — Assessment & Plan Note (Signed)
Blood pressure is normal today. 

## 2013-12-30 NOTE — Assessment & Plan Note (Signed)
Single-vessel, status post SVG to RCA in may. Symptomatically stable. Continues on aspirin, beta blocker, and ACE inhibitor.

## 2013-12-30 NOTE — Patient Instructions (Addendum)
Your physician recommends that you schedule a follow-up appointment in: 4 months     Your physician recommends that you continue on your current medications as directed. Please refer to the Current Medication list given to you today.      Thank you for choosing [facility] !         

## 2013-12-30 NOTE — Assessment & Plan Note (Signed)
Status post resection and grafting with a 30 mm Hemashield graft in May by Dr. Prescott Gum. Doing well postoperatively.

## 2014-03-27 ENCOUNTER — Encounter (HOSPITAL_COMMUNITY): Payer: Self-pay | Admitting: Cardiology

## 2014-07-02 ENCOUNTER — Other Ambulatory Visit: Payer: Self-pay | Admitting: *Deleted

## 2014-07-02 DIAGNOSIS — I712 Thoracic aortic aneurysm, without rupture, unspecified: Secondary | ICD-10-CM

## 2014-07-25 LAB — CREATININE, SERUM: Creat: 0.7 mg/dL (ref 0.50–1.35)

## 2014-07-25 LAB — BUN: BUN: 9 mg/dL (ref 6–23)

## 2014-07-30 ENCOUNTER — Encounter: Payer: Self-pay | Admitting: Cardiothoracic Surgery

## 2014-07-30 ENCOUNTER — Ambulatory Visit (INDEPENDENT_AMBULATORY_CARE_PROVIDER_SITE_OTHER): Payer: Medicare Other | Admitting: Cardiothoracic Surgery

## 2014-07-30 ENCOUNTER — Ambulatory Visit
Admission: RE | Admit: 2014-07-30 | Discharge: 2014-07-30 | Disposition: A | Discharge status: Home / Self Care | Payer: Medicare Other | Source: Ambulatory Visit | Attending: Cardiothoracic Surgery | Admitting: Cardiothoracic Surgery

## 2014-07-30 VITALS — BP 108/69 | HR 53 | Resp 16 | Ht 69.0 in | Wt 162.0 lb

## 2014-07-30 DIAGNOSIS — I2510 Atherosclerotic heart disease of native coronary artery without angina pectoris: Secondary | ICD-10-CM

## 2014-07-30 DIAGNOSIS — Z9889 Other specified postprocedural states: Secondary | ICD-10-CM

## 2014-07-30 DIAGNOSIS — I712 Thoracic aortic aneurysm, without rupture, unspecified: Secondary | ICD-10-CM

## 2014-07-30 DIAGNOSIS — Z8679 Personal history of other diseases of the circulatory system: Secondary | ICD-10-CM

## 2014-07-30 DIAGNOSIS — Z951 Presence of aortocoronary bypass graft: Secondary | ICD-10-CM

## 2014-07-30 DIAGNOSIS — I7121 Aneurysm of the ascending aorta, without rupture: Secondary | ICD-10-CM

## 2014-07-30 MED ORDER — IOHEXOL 350 MG/ML SOLN
75.0000 mL | Freq: Once | INTRAVENOUS | Status: AC | PRN
  Administered 2014-07-30: 75 mL via INTRAVENOUS

## 2014-07-30 NOTE — Progress Notes (Signed)
PCP is Leonides Grills, MD Referring Provider is [place], Peter M, MD  Chief Complaint  Patient presents with  . Routine Post Op    8 month f/u with CTA CHEST to assess aortic aneurysm repair comined with CABG    HPI:the patient returns for one year followup with CTA after resection grafting May 5 centimeter fusiform ascending thoracic aneurysm with combined CABG. The patient is done well after surgery without symptoms of angina or CHF. Unfortunately he has resumed smoking but is trying to quit again. His blood pressure is been well controlled and his weight is stable.  Today he had a CTA to review the surgical repair. The ascending aneurysm was resected with his aortic diameter now 3.3 cm. There is no evidence of false aneurysm at the distal anastomosis. The arch and descending thoracic aorta are within normal limits.  Of note the right thyroid lobe as a 1.5 cm nodule which was not present on the scan last year. There were no at risk pulmonary nodules or abnormal mediastinal nodes on this study  Past Medical History  Diagnosis Date  . Spondylitis, ankylosing   . Essential hypertension, benign   . BPH (benign prostatic hyperplasia)   . Depression   . PTSD (post-traumatic stress disorder)   . Cataracts, bilateral   . Left rib fracture   . Coronary atherosclerosis of native coronary artery     Single-vessel status post SVG to RCA May 2015  . COPD (chronic obstructive pulmonary disease)   . Arthritis   . Thoracic aortic aneurysm     Status post repair May 2015    Past Surgical History  Procedure Laterality Date  . Bone cyst excision      Left foot, [name]  . Transurethral resection of prostate  06/16/2011    Procedure: TRANSURETHRAL RESECTION OF THE PROSTATE (TURP);  Surgeon: Marissa Nestle, MD;  Location: [facility];  Service: Urology;  Laterality: N/A;  . Replacement ascending aorta N/A 08/22/2013    Procedure: REPLACEMENT ASCENDING AORTA with hemashield graft;  Surgeon: Ivin Poot, MD;  Location: [place];  Service: Open Heart Surgery;  Laterality: N/A;  . Intraoperative transesophageal echocardiogram N/A 08/22/2013    Procedure: INTRAOPERATIVE TRANSESOPHAGEAL ECHOCARDIOGRAM;  Surgeon: Ivin Poot, MD;  Location: [place];  Service: Open Heart Surgery;  Laterality: N/A;  . Coronary artery bypass graft N/A 08/22/2013    Procedure: CORONARY ARTERY BYPASS GRAFTING (CABG) TIMES ONE USING RIGHT THIGH GREATER SAPHENOUS VEIN HARVESTED ENDOSCOPICALLY;  Surgeon: Ivin Poot, MD;  Location: [place];  Service: Open Heart Surgery;  Laterality: N/A;  . Left and right heart catheterization with coronary angiogram N/A 07/17/2013    Procedure: LEFT AND RIGHT HEART CATHETERIZATION WITH CORONARY ANGIOGRAM;  Surgeon: Peter M Martinique, MD;  Location: [facility];  Service: Cardiovascular;  Laterality: N/A;    Family History  Problem Relation Age of Onset  . Anesthesia problems Neg Hx   . Hypotension Neg Hx   . Malignant hyperthermia Neg Hx   . Pseudochol deficiency Neg Hx   . Heart failure Mother   . Heart failure Father   . Heart disease Father   . Heart disease Paternal Uncle     Social History History  Substance Use Topics  . Smoking status: Current Every Day Smoker -- 1.00 packs/day for 40 years    Types: Cigarettes  . Smokeless tobacco: Never Used     Comment: smoking "a few, not as many as it used to be"  .  Alcohol Use: 1.8 oz/week    1 Glasses of wine, 2 Cans of beer per week     Comment: 2-4 beers daily    Current Outpatient Prescriptions  Medication Sig Dispense Refill  . aspirin 325 MG tablet Take 325 mg by mouth daily.    . Flaxseed, Linseed, (FLAX SEED OIL) 1000 MG CAPS Take 2 capsules by mouth daily.    Marland Kitchen HYDROcodone-acetaminophen (NORCO) 10-325 MG per tablet Take 1 tablet by mouth every 6 (six) hours as needed.    Marland Kitchen lisinopril (PRINIVIL,ZESTRIL) 5 MG tablet Take 1 tablet (5 mg total) by mouth daily. 30 tablet 1  . metoprolol (LOPRESSOR) 25 MG tablet Take 1  tablet (25 mg total) by mouth 2 (two) times daily. 60 tablet 1  . Multiple Vitamin (MULITIVITAMIN WITH MINERALS) TABS Take 1 tablet by mouth daily.    . Omega-3 Fatty Acids (FISH OIL) 1000 MG CAPS Take 2 capsules by mouth daily.    . sertraline (ZOLOFT) 100 MG tablet Take 100 mg by mouth daily.    . simvastatin (ZOCOR) 10 MG tablet Take 10 mg by mouth daily.    . traZODone (DESYREL) 150 MG tablet Take 150 mg by mouth at bedtime.     No current facility-administered medications for this visit.    Allergies  Allergen Reactions  . Indocin [Indomethacin] Other (See Comments)    Migrane    Review of Systems   Gen.-no weight loss HEENT-no headache or change in vision or difficulty with his teeth Thoracic-no pneumonia bronchitis Cardiac-no palpitations angina Abdomen-no pain nausea vomiting Musculoskeletal-slowly progressive problems with pain from ankylosing spondylitis Neurologic-no TIA no syncope Hematologic no bleeding episodes  BP 108/69 mmHg  Pulse 53  Resp 16  Ht 5\' 9"  (1.753 m)  Wt 162 lb (73.483 kg)  BMI 23.91 kg/m2  SpO2 95% Physical Exam  General: middle-aged Caucasian male alert and comfortable HEENT: Normocephalic pupils equal , dentition adequate Neck: Supple without JVD, adenopathy, or bruit Chest: Clear to auscultation, symmetrical breath sounds, no rhonchi, no tenderness             or deformity. Sternal incision well-healed Cardiovascular: Regular rate and rhythm, no murmur, no gallop, peripheral pulses             palpable in all extremities Abdomen:  Soft, nontender, no palpable mass or organomegaly Extremities: Warm, well-perfused, no clubbing cyanosis edema or tenderness,              no venous stasis changes of the legs Rectal/GU: Deferred Neuro: Grossly non--focal and symmetrical throughout Skin: Clean and dry without rash or ulceration   Diagnostic Tests: CTA of the thoracic aorta shows an intact repair of the ascending aorta. This was reviewed  with patient and his wife. No residual aneurysm or false aneurysm.  Impression: One year postop resection grafting of a 5 cm fusiform ascending aneurysm. Excellent postop recovery. No further CT scans are needed.  We will relate to his primary care that the right thyroid lobe now has a 1.5 cm nodular density and may need further evaluation which I would defer to be medical team.  Plan:return here as needed.   Len Childs, MD [facility] [phone]

## 2014-08-26 IMAGING — CT CT ANGIO CHEST
1 of 5 series · 1 of 31 positions shown · IV contrast (omnipaque)
Comparison: 03/26/2013

CLINICAL DATA: Thoracic aneurysm, preop planning

EXAM:
CT ANGIOGRAPHY CHEST WITH CONTRAST
TECHNIQUE: Multidetector CT imaging of the chest was performed using the
standard protocol during bolus administration of intravenous
contrast. Multiplanar CT image reconstructions and MIPs were
obtained to evaluate the vascular anatomy.
CONTRAST:  100mL OMNIPAQUE IOHEXOL 350 MG/ML SOLN

[Series 300: locator · axial · 0.59mm/px · 1 of 1 slices shown]
[im 1/1  lung]
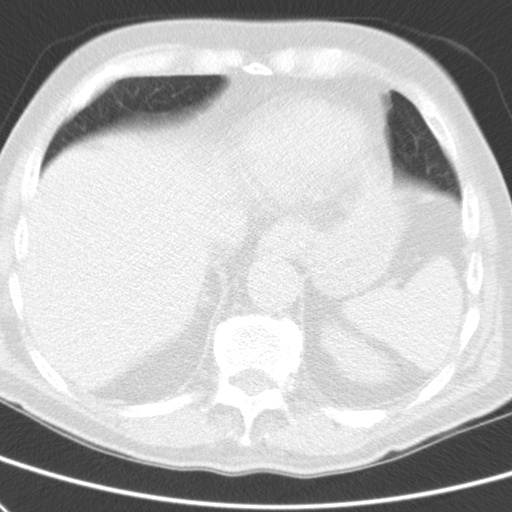

[1 of 31 positions shown; findings below may reference images not displayed]

FINDINGS: The noncontrast scout shows scattered coronary and aortic
calcifications. No hyperdense crescent, mediastinal hematoma,
pleural or pericardial effusion.

On CTA, thoracic aorta measures 3.6 cm at the level of Mao
junction, 4.8 cm proximal ascending (previously 4.9 cm), 4.7 cm
distal ascending, 4.2 cm proximal arch, 3.2 cm distal arch, 3.5 cm
proximal descending, tapering to 2.8 cm distal descending just above
the diaphragm. There is no dissection or stenosis. Brachiocephalic
arterial origins are widely patent. The left vertebral artery
originates directly from the aortic arch, a common anatomic variant.
There is fairly good contrast opacification of pulmonary artery
branches; the exam was not optimized for detection of pulmonary
emboli.

No pleural or pericardial effusion. There is mild dependent
atelectasis posteriorly in the right upper lobe and both lower
lobes. 5 mm nodule adjacent to the right minor fissure, previously 4
mm. Lungs are otherwise clear. Visualized portions of upper abdomen
unremarkable. Ankylosis in the lower cervical and mid thoracic
spine, with Sclerotic changes in several contiguous mid thoracic
vertebral bodies stable since prior study.

Review of the MIP images confirms the above findings.
IMPRESSION: 1. Stable 4.8 cm ascending aortic aneurysm without complicating
features.

## 2014-10-01 IMAGING — CR DG CHEST 2V
2 series · 2 of 2 positions shown · non-contrast
Comparison: Chest x-ray 08/27/2013 .

CLINICAL DATA: Aneurysm.  Cough shortness of breath.

EXAM:
CHEST  2 VIEW

[w chest pa]
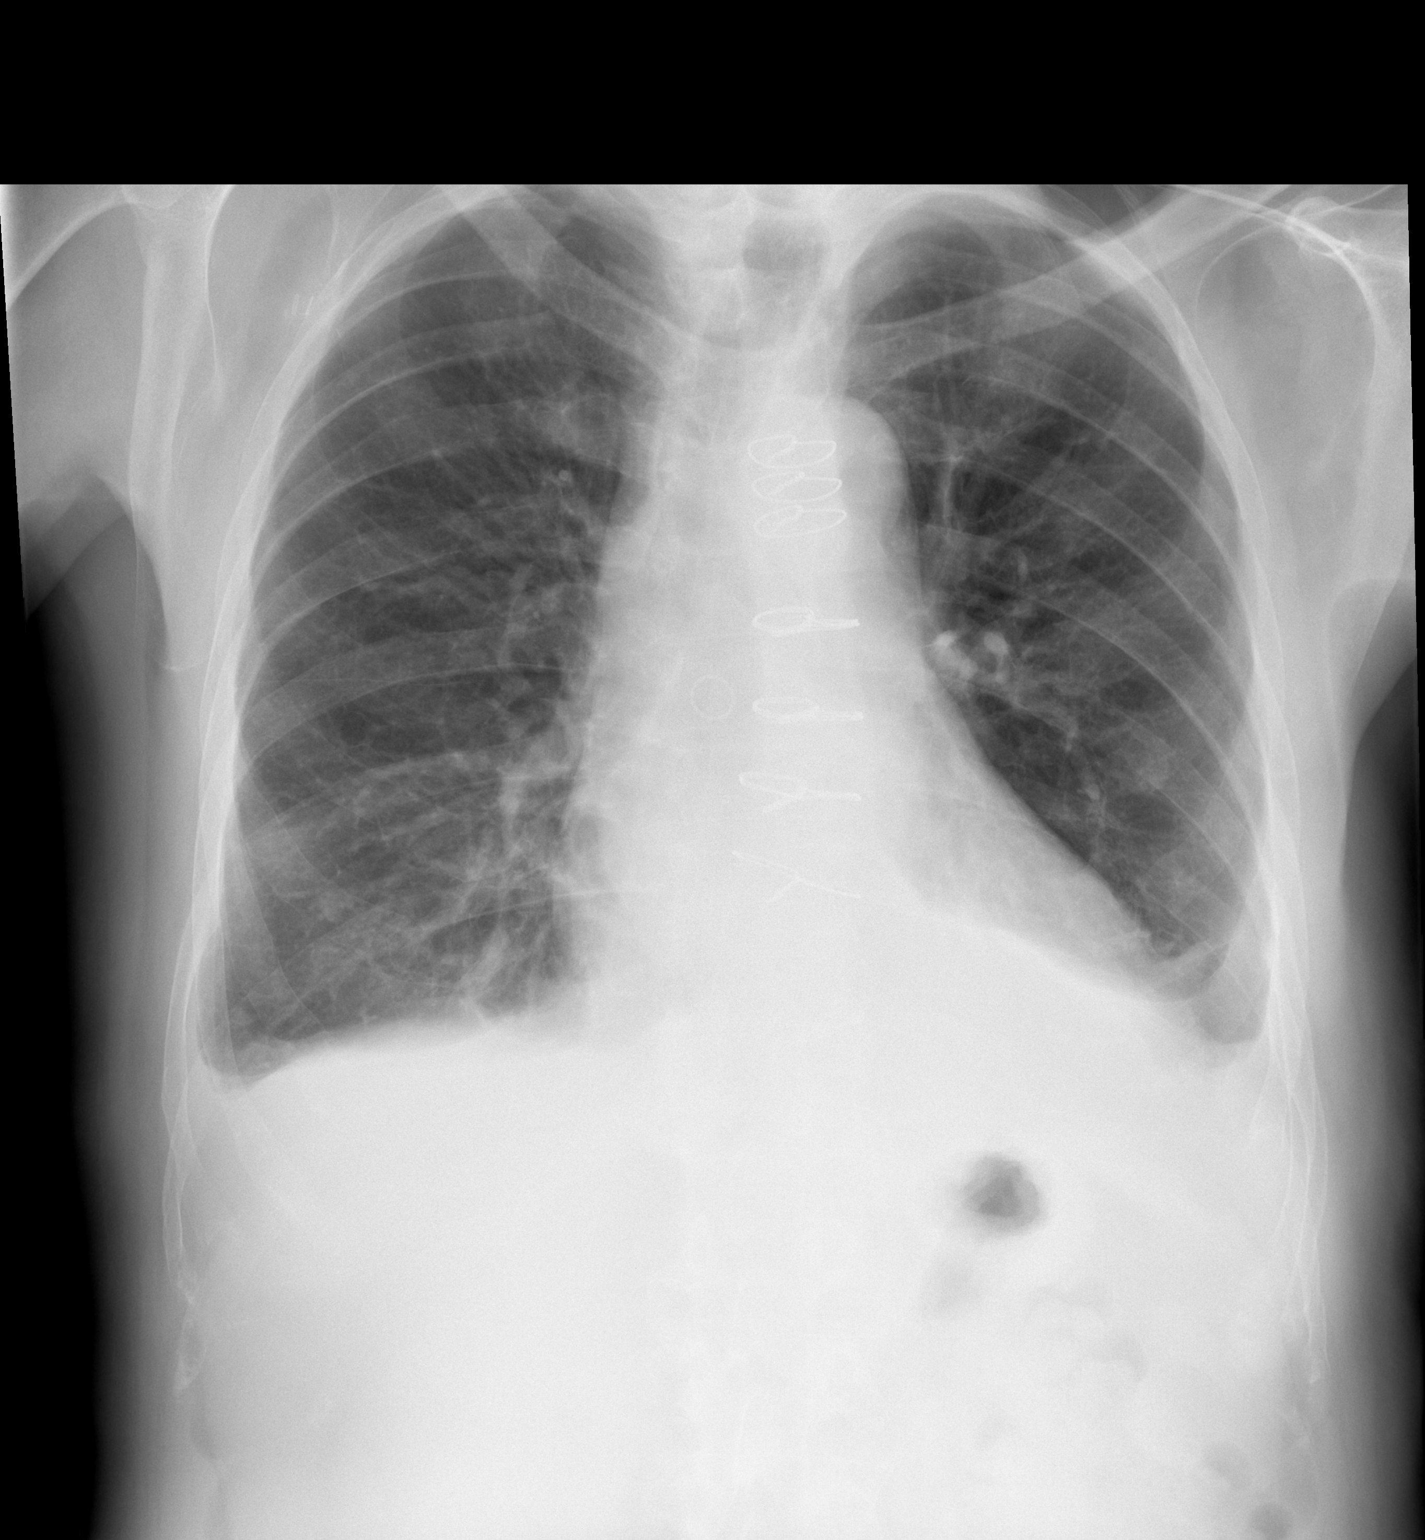

[w chest lat]
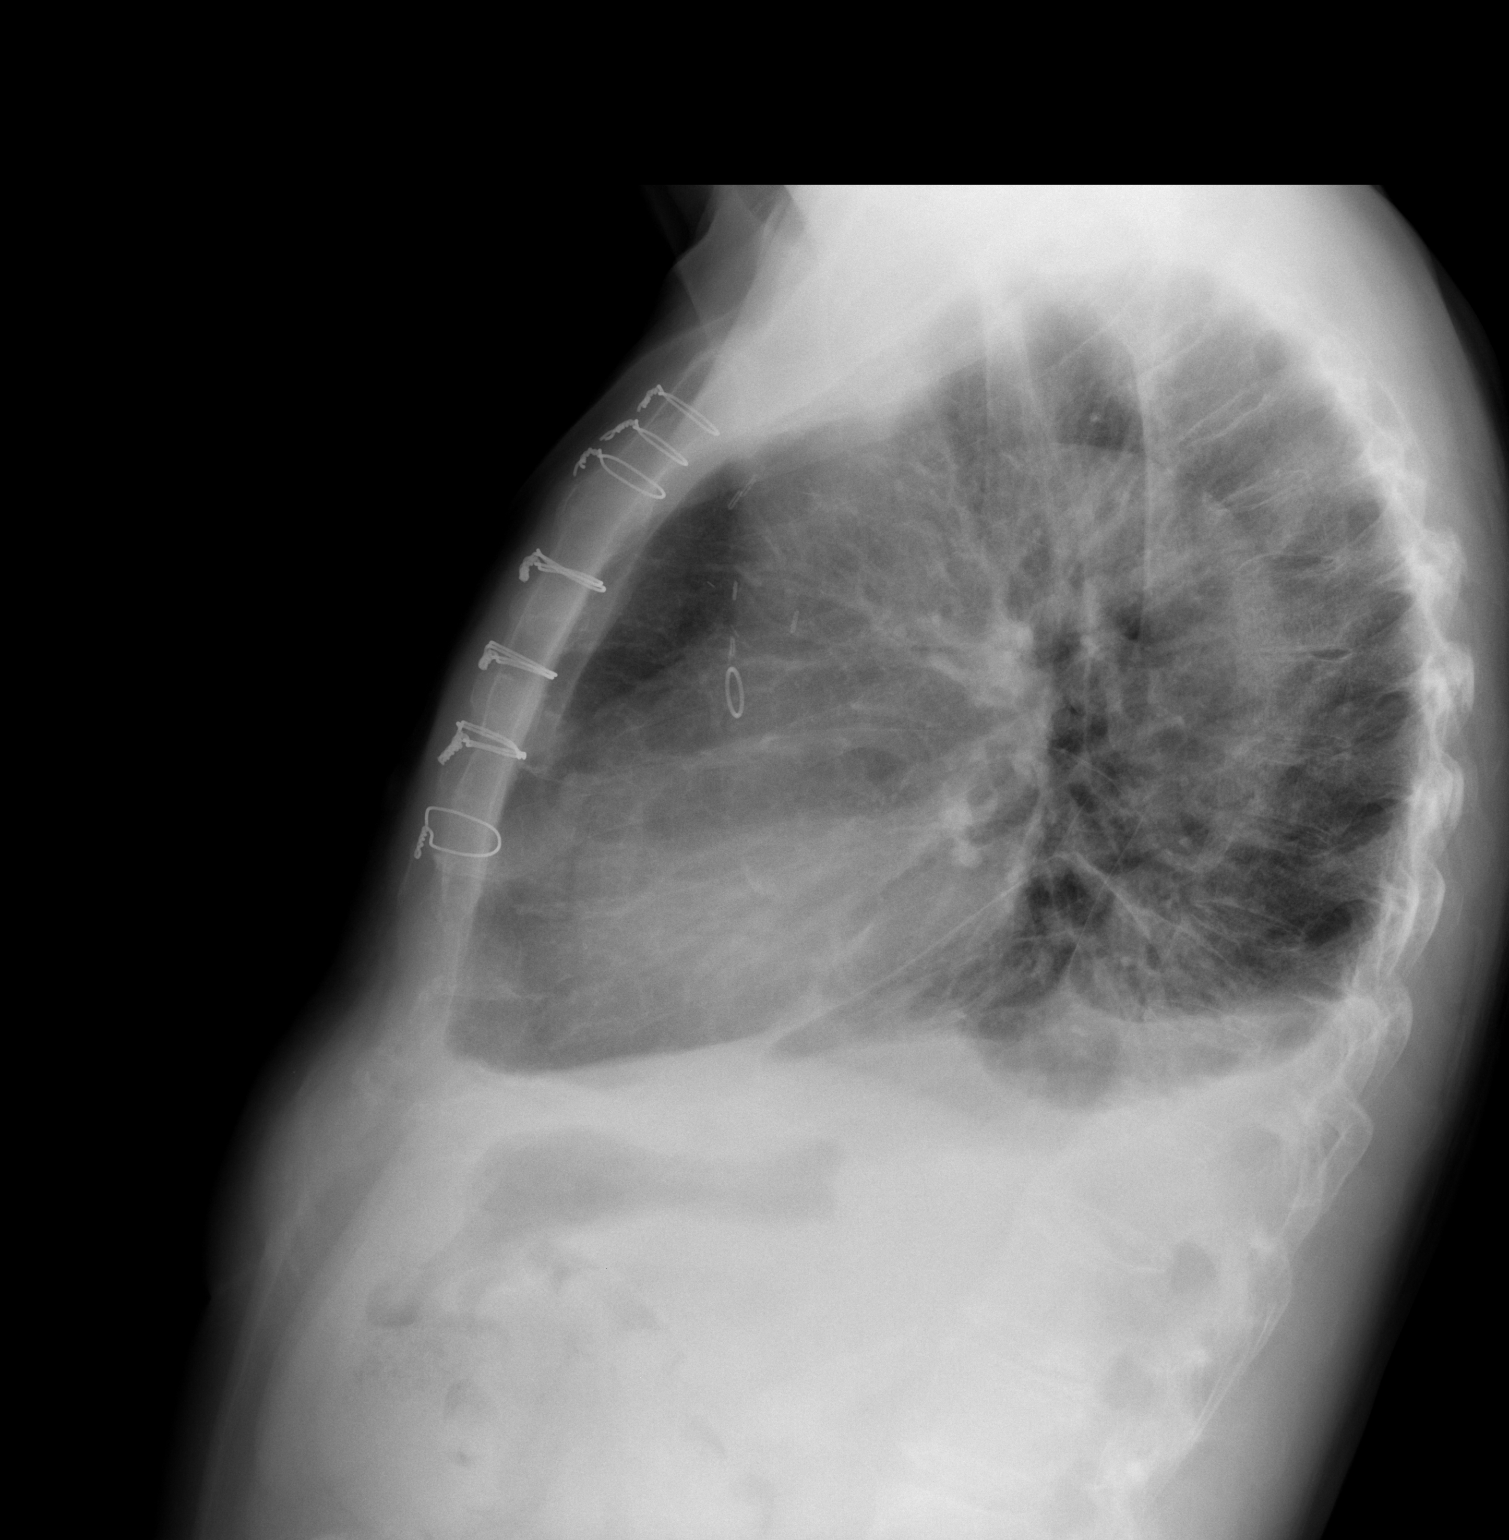

[2 of 2 positions shown; findings below may reference images not displayed]

FINDINGS: Mediastinum hilar structures normal. Cardiomegaly. Prior CABG.
Pulmonary vascularity is normal. Basilar atelectasis and or
infiltrates with small pleural effusions noted. Right pleural
effusion is new from prior exam. A component congestive heart
failure cannot be excluded.
IMPRESSION: 1. Cardiomegaly.  Prior CABG.
2. Basilar atelectasis and/or infiltrates with small pleural
effusions noted. A component of congestive heart failure cannot be
excluded. Right pleural effusion new from prior exam .

## 2014-10-10 ENCOUNTER — Other Ambulatory Visit: Payer: Self-pay | Admitting: Physician Assistant

## 2014-10-13 ENCOUNTER — Other Ambulatory Visit: Payer: Self-pay

## 2015-10-29 ENCOUNTER — Ambulatory Visit (INDEPENDENT_AMBULATORY_CARE_PROVIDER_SITE_OTHER): Payer: Medicare Other | Admitting: Orthopaedic Surgery

## 2015-10-29 ENCOUNTER — Encounter: Payer: Self-pay | Admitting: Orthopaedic Surgery

## 2015-10-29 VITALS — BP 139/88 | HR 80 | Temp 98.1°F | Ht 65.0 in | Wt 173.6 lb

## 2015-10-29 DIAGNOSIS — M7021 Olecranon bursitis, right elbow: Secondary | ICD-10-CM

## 2015-10-29 NOTE — Progress Notes (Signed)
Subjective: my right elbow is swollen    Patient ID: Samuel Long, male    DOB: Mar 05, 1946, 70 y.o.   MRN: [id number]  HPI He has a two to three week history of pain and swelling of the right olecranon bursa.  He has no known trauma.  It has not been red.  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  He has no real pain but it just bothers him.  He has no numbness.  He has no other joint problems.   Review of Systems  HENT: Negative for congestion.   Respiratory: Negative for cough and shortness of breath.   Cardiovascular: Negative for chest pain and leg swelling.  Endocrine: Positive for cold intolerance.  Musculoskeletal: Positive for joint swelling and arthralgias.  Allergic/Immunologic: Positive for environmental allergies.   Past Medical History  Diagnosis Date  . Spondylitis, ankylosing ([place])   . Essential hypertension, benign   . BPH (benign prostatic hyperplasia)   . Depression   . PTSD (post-traumatic stress disorder)   . Cataracts, bilateral   . Left rib fracture   . Coronary atherosclerosis of native coronary artery     Single-vessel status post SVG to RCA May 2015  . COPD (chronic obstructive pulmonary disease) ([place])   . Arthritis   . Thoracic aortic aneurysm [facility])     Status post repair May 2015    Past Surgical History  Procedure Laterality Date  . Bone cyst excision      Left foot, [place]  . Transurethral resection of prostate  06/16/2011    Procedure: TRANSURETHRAL RESECTION OF THE PROSTATE (TURP);  Surgeon: Marissa Nestle, MD;  Location: [facility];  Service: Urology;  Laterality: N/A;  . Replacement ascending aorta N/A 08/22/2013    Procedure: REPLACEMENT ASCENDING AORTA with hemashield graft;  Surgeon: Ivin Poot, MD;  Location: [place];  Service: Open Heart Surgery;  Laterality: N/A;  . Intraoperative transesophageal echocardiogram N/A 08/22/2013    Procedure: INTRAOPERATIVE TRANSESOPHAGEAL ECHOCARDIOGRAM;   Surgeon: Ivin Poot, MD;  Location: [place];  Service: Open Heart Surgery;  Laterality: N/A;  . Coronary artery bypass graft N/A 08/22/2013    Procedure: CORONARY ARTERY BYPASS GRAFTING (CABG) TIMES ONE USING RIGHT THIGH GREATER SAPHENOUS VEIN HARVESTED ENDOSCOPICALLY;  Surgeon: Ivin Poot, MD;  Location: [place];  Service: Open Heart Surgery;  Laterality: N/A;  . Left and right heart catheterization with coronary angiogram N/A 07/17/2013    Procedure: LEFT AND RIGHT HEART CATHETERIZATION WITH CORONARY ANGIOGRAM;  Surgeon: Peter M Martinique, MD;  Location: [facility] CATH LAB;  Service: Cardiovascular;  Laterality: N/A;    Current Outpatient Prescriptions on File Prior to Visit  Medication Sig Dispense Refill  . aspirin 325 MG tablet Take 325 mg by mouth daily.    . Flaxseed, Linseed, (FLAX SEED OIL) 1000 MG CAPS Take 2 capsules by mouth daily.    Marland Kitchen HYDROcodone-acetaminophen (NORCO) 10-325 MG per tablet Take 1 tablet by mouth every 6 (six) hours as needed.    Marland Kitchen lisinopril (PRINIVIL,ZESTRIL) 5 MG tablet Take 1 tablet (5 mg total) by mouth daily. 30 tablet 1  . metoprolol (LOPRESSOR) 25 MG tablet Take 1 tablet (25 mg total) by mouth 2 (two) times daily. 60 tablet 1  . Multiple Vitamin (MULITIVITAMIN WITH MINERALS) TABS Take 1 tablet by mouth daily.    . Omega-3 Fatty Acids (FISH OIL) 1000 MG CAPS Take 2 capsules by mouth daily.    Marland Kitchen  sertraline (ZOLOFT) 100 MG tablet Take 100 mg by mouth daily.    . simvastatin (ZOCOR) 10 MG tablet Take 10 mg by mouth daily.    . traZODone (DESYREL) 150 MG tablet Take 150 mg by mouth at bedtime.     No current facility-administered medications on file prior to visit.    Social History   Social History  . Marital Status: Married    Spouse Name: N/A  . Number of Children: N/A  . Years of Education: N/A   Occupational History  . Not on file.   Social History Main Topics  . Smoking status: Current Every Day Smoker -- 1.00 packs/day for 40 years    Types:  Cigarettes  . Smokeless tobacco: Never Used     Comment: smoking "a few, not as many as it used to be"  . Alcohol Use: 1.8 oz/week    1 Glasses of wine, 2 Cans of beer per week     Comment: 2-4 beers daily  . Drug Use: No     Comment: Former marijuana, remote past  . Sexual Activity: No   Other Topics Concern  . Not on file   Social History Narrative    Family History  Problem Relation Age of Onset  . Anesthesia problems Neg Hx   . Hypotension Neg Hx   . Malignant hyperthermia Neg Hx   . Pseudochol deficiency Neg Hx   . Heart failure Mother   . Heart failure Father   . Heart disease Father   . Heart disease Paternal Uncle     BP 139/88 mmHg  Pulse 80  Temp(Src) 98.1 F (36.7 C)  Ht 5\' 5"  (1.651 m)  Wt 173 lb 9.6 oz (78.744 kg)  BMI 28.89 kg/m2     Objective:   Physical Exam  Constitutional: He is oriented to person, place, and time. He appears well-developed and well-nourished.  HENT:  Head: Normocephalic and atraumatic.  Eyes: Conjunctivae and EOM are normal. Pupils are equal, round, and reactive to light.  Neck: Normal range of motion. Neck supple.  Cardiovascular: Normal rate, regular rhythm and intact distal pulses.   Pulmonary/Chest: Effort normal.  Abdominal: Soft.  Musculoskeletal: He exhibits tenderness (The right olecranon bursa is enlarged size of a large egg.  It is not red, not tender, not draining.  ROM is full.  NV intact.  Left elbow normal.).  Neurological: He is alert and oriented to person, place, and time. He has normal reflexes. No cranial nerve deficit. He exhibits normal muscle tone. Coordination normal.  Skin: Skin is warm and dry.  Psychiatric: He has a normal mood and affect. His behavior is normal. Judgment and thought content normal.    PROCEDURE NOTE: The right olecranon area was prepped sterilely after getting permission from patient for aspiration.  The bursa was aspirated of about 5 cc of dark red bloody material by sterile  technique tolerated well.  Ethyl chloride was used.  A dressing and then a Ace bandage applied.      Assessment & Plan:   Encounter Diagnosis  Name Primary?  Marland Kitchen Olecranon bursitis of right elbow Yes   I will see him back in two weeks.  He may remove the dressing this evening.  I told him it could recur as quick as tonight.  If any problem he is to call.  Precautions given.  Electronically Signed Sanjuana Kava, MD 7/13/20173:58 PM

## 2015-11-12 ENCOUNTER — Ambulatory Visit: Payer: Medicare Other | Admitting: Orthopaedic Surgery

## 2015-11-17 ENCOUNTER — Encounter: Payer: Self-pay | Admitting: Orthopaedic Surgery

## 2015-11-17 ENCOUNTER — Ambulatory Visit (INDEPENDENT_AMBULATORY_CARE_PROVIDER_SITE_OTHER): Payer: Medicare Other | Admitting: Orthopaedic Surgery

## 2015-11-17 VITALS — BP 128/71 | HR 85 | Temp 98.1°F | Ht 65.0 in | Wt 173.6 lb

## 2015-11-17 DIAGNOSIS — M7021 Olecranon bursitis, right elbow: Secondary | ICD-10-CM | POA: Diagnosis not present

## 2015-11-17 NOTE — Progress Notes (Signed)
Patient [id number] Samuel Long, male DOB:12-17-1945, 70 y.o. [id number]  Chief Complaint  Patient presents with  . Follow-up    Right elbow    HPI  Samuel Long is a 70 y.o. male my right elbow is doing great! He says.  He is much improved with no pain, no swelling, no difficulty.  His problem is resolved.  I went over some precautions with him.  I will see as needed. HPI  Body mass index is 28.89 kg/m.  ROS  Review of Systems  HENT: Negative for congestion.   Respiratory: Negative for cough and shortness of breath.   Cardiovascular: Negative for chest pain and leg swelling.  Endocrine: Positive for cold intolerance.  Musculoskeletal: Positive for arthralgias and joint swelling.  Allergic/Immunologic: Positive for environmental allergies.    Past Medical History:  Diagnosis Date  . Arthritis   . BPH (benign prostatic hyperplasia)   . Cataracts, bilateral   . COPD (chronic obstructive pulmonary disease) ([place])   . Coronary atherosclerosis of native coronary artery    Single-vessel status post SVG to RCA May 2015  . Depression   . Essential hypertension, benign   . Left rib fracture   . PTSD (post-traumatic stress disorder)   . Spondylitis, ankylosing ([place])   . Thoracic aortic aneurysm [facility])    Status post repair May 2015    Past Surgical History:  Procedure Laterality Date  . BONE CYST EXCISION     Left foot, Samuel Long  . CORONARY ARTERY BYPASS GRAFT N/A 08/22/2013   Procedure: CORONARY ARTERY BYPASS GRAFTING (CABG) TIMES ONE USING RIGHT THIGH GREATER SAPHENOUS VEIN HARVESTED ENDOSCOPICALLY;  Surgeon: Ivin Poot, MD;  Location: [place];  Service: Open Heart Surgery;  Laterality: N/A;  . INTRAOPERATIVE TRANSESOPHAGEAL ECHOCARDIOGRAM N/A 08/22/2013   Procedure: INTRAOPERATIVE TRANSESOPHAGEAL ECHOCARDIOGRAM;  Surgeon: Ivin Poot, MD;  Location: [place];  Service: Open Heart Surgery;  Laterality: N/A;  . LEFT AND RIGHT HEART CATHETERIZATION WITH CORONARY ANGIOGRAM N/A 07/17/2013    Procedure: LEFT AND RIGHT HEART CATHETERIZATION WITH CORONARY ANGIOGRAM;  Surgeon: Peter M Martinique, MD;  Location: [facility] CATH LAB;  Service: Cardiovascular;  Laterality: N/A;  . REPLACEMENT ASCENDING AORTA N/A 08/22/2013   Procedure: REPLACEMENT ASCENDING AORTA with hemashield graft;  Surgeon: Ivin Poot, MD;  Location: [place];  Service: Open Heart Surgery;  Laterality: N/A;  . TRANSURETHRAL RESECTION OF PROSTATE  06/16/2011   Procedure: TRANSURETHRAL RESECTION OF THE PROSTATE (TURP);  Surgeon: Marissa Nestle, MD;  Location: [facility];  Service: Urology;  Laterality: N/A;    Family History  Problem Relation Age of Onset  . Heart failure Mother   . Heart failure Father   . Heart disease Father   . Heart disease Paternal Uncle   . Anesthesia problems Neg Hx   . Hypotension Neg Hx   . Malignant hyperthermia Neg Hx   . Pseudochol deficiency Neg Hx     Social History Social History  Substance Use Topics  . Smoking status: Current Every Day Smoker    Packs/day: 1.00    Years: 40.00    Types: Cigarettes  . Smokeless tobacco: Never Used     Comment: smoking "a few, not as many as it used to be"  . Alcohol use 1.8 oz/week    1 Glasses of wine, 2 Cans of beer per week     Comment: 2-4 beers daily    Allergies  Allergen Reactions  . Indocin [Indomethacin] Other (See Comments)    Migrane  Current Outpatient Prescriptions  Medication Sig Dispense Refill  . aspirin 325 MG tablet Take 325 mg by mouth daily.    . Flaxseed, Linseed, (FLAX SEED OIL) 1000 MG CAPS Take 2 capsules by mouth daily.    Marland Kitchen HYDROcodone-acetaminophen (NORCO) 10-325 MG per tablet Take 1 tablet by mouth every 6 (six) hours as needed.    Marland Kitchen lisinopril (PRINIVIL,ZESTRIL) 5 MG tablet Take 1 tablet (5 mg total) by mouth daily. 30 tablet 1  . metoprolol (LOPRESSOR) 25 MG tablet Take 1 tablet (25 mg total) by mouth 2 (two) times daily. 60 tablet 1  . Multiple Vitamin (MULITIVITAMIN WITH MINERALS) TABS Take 1 tablet by  mouth daily.    . Omega-3 Fatty Acids (FISH OIL) 1000 MG CAPS Take 2 capsules by mouth daily.    . sertraline (ZOLOFT) 100 MG tablet Take 100 mg by mouth daily.    . simvastatin (ZOCOR) 10 MG tablet Take 10 mg by mouth daily.    . traZODone (DESYREL) 150 MG tablet Take 150 mg by mouth at bedtime.     No current facility-administered medications for this visit.      Physical Exam  Blood pressure 128/71, pulse 85, temperature 98.1 F (36.7 C), height 5\' 5"  (1.651 m), weight 173 lb 9.6 oz (78.7 kg).  Constitutional: overall normal hygiene, normal nutrition, well developed, normal grooming, normal body habitus. Assistive device:none  Musculoskeletal: gait and station Limp none, muscle tone and strength are normal, no tremors or atrophy is present.  .  Neurological: coordination overall normal.  Deep tendon reflex/nerve stretch intact.  Sensation normal.  Cranial nerves II-XII intact.   Skin:   normal overall no scars, lesions, ulcers or rashes. No psoriasis.  Psychiatric: Alert and oriented x 3.  Recent memory intact, remote memory unclear.  Normal mood and affect. Well groomed.  Good eye contact.  Cardiovascular: overall no swelling, no varicosities, no edema bilaterally, normal temperatures of the legs and arms, no clubbing, cyanosis and good capillary refill.  Lymphatic: palpation is normal.  He has full ROM of the right elbow with no swelling, no pain.  NV intact.  Left elbow normal.  The patient has been educated about the nature of the problem(s) and counseled on treatment options.  The patient appeared to understand what I have discussed and is in agreement with it.  Encounter Diagnosis  Name Primary?  Marland Kitchen Olecranon bursitis of right elbow Yes    PLAN Call if any problems.  Precautions discussed.  Continue current medications.   Return to clinic prn   Electronically [place], MD 8/1/20172:56 PM

## 2016-01-27 ENCOUNTER — Emergency Department (HOSPITAL_COMMUNITY)
Admission: EM | Admit: 2016-01-27 | Discharge: 2016-01-27 | Disposition: A | Discharge status: Home / Self Care | Payer: Medicare Other | Attending: Emergency Medicine | Admitting: Emergency Medicine

## 2016-01-27 ENCOUNTER — Encounter (HOSPITAL_COMMUNITY): Payer: Self-pay

## 2016-01-27 DIAGNOSIS — I10 Essential (primary) hypertension: Secondary | ICD-10-CM | POA: Diagnosis not present

## 2016-01-27 DIAGNOSIS — J449 Chronic obstructive pulmonary disease, unspecified: Secondary | ICD-10-CM | POA: Insufficient documentation

## 2016-01-27 DIAGNOSIS — Z7982 Long term (current) use of aspirin: Secondary | ICD-10-CM | POA: Insufficient documentation

## 2016-01-27 DIAGNOSIS — I2510 Atherosclerotic heart disease of native coronary artery without angina pectoris: Secondary | ICD-10-CM | POA: Insufficient documentation

## 2016-01-27 DIAGNOSIS — Z951 Presence of aortocoronary bypass graft: Secondary | ICD-10-CM | POA: Diagnosis not present

## 2016-01-27 DIAGNOSIS — R319 Hematuria, unspecified: Secondary | ICD-10-CM | POA: Diagnosis present

## 2016-01-27 DIAGNOSIS — Z79899 Other long term (current) drug therapy: Secondary | ICD-10-CM | POA: Insufficient documentation

## 2016-01-27 DIAGNOSIS — F17210 Nicotine dependence, cigarettes, uncomplicated: Secondary | ICD-10-CM | POA: Diagnosis not present

## 2016-01-27 DIAGNOSIS — R310 Gross hematuria: Secondary | ICD-10-CM | POA: Diagnosis not present

## 2016-01-27 LAB — URINE MICROSCOPIC-ADD ON

## 2016-01-27 LAB — BASIC METABOLIC PANEL
ANION GAP: 8 (ref 5–15)
BUN: 8 mg/dL (ref 6–20)
CHLORIDE: 99 mmol/L — AB (ref 101–111)
CO2: 27 mmol/L (ref 22–32)
Calcium: 9.3 mg/dL (ref 8.9–10.3)
Creatinine, Ser: 0.6 mg/dL — ABNORMAL LOW (ref 0.61–1.24)
GFR calc non Af Amer: >60 mL/min (ref >60)
Glucose, Bld: 97 mg/dL (ref 65–99)
POTASSIUM: 3.6 mmol/L (ref 3.5–5.1)
SODIUM: 134 mmol/L — AB (ref 135–145)

## 2016-01-27 LAB — URINALYSIS, ROUTINE W REFLEX MICROSCOPIC
Bilirubin Urine: NEGATIVE
Glucose, UA: 100 mg/dL — AB
Ketones, ur: 15 mg/dL — AB
NITRITE: POSITIVE — AB
Protein, ur: >300 mg/dL — AB
SPECIFIC GRAVITY, URINE: 1.01 (ref 1.005–1.030)
pH: 6.5 (ref 5.0–8.0)

## 2016-01-27 LAB — CBC
HEMATOCRIT: 50.1 % (ref 39.0–52.0)
HEMOGLOBIN: 17.3 g/dL — AB (ref 13.0–17.0)
MCH: 33.1 pg (ref 26.0–34.0)
MCHC: 34.5 g/dL (ref 30.0–36.0)
MCV: 96 fL (ref 78.0–100.0)
Platelets: 293 10*3/uL (ref 150–400)
RBC: 5.22 MIL/uL (ref 4.22–5.81)
RDW: 14.1 % (ref 11.5–15.5)
WBC: 10.5 10*3/uL (ref 4.0–10.5)

## 2016-01-27 NOTE — Discharge Instructions (Signed)
Return to the hospital for worsening symptoms as we discussed.  [facility] is preferable for urology evaluation

## 2016-01-27 NOTE — ED Triage Notes (Signed)
Pt reports today started out having pinkish colored urine this morning then bleeding progressed throughout the day and last time he voided he was passing blood clots.  Pt says started to feel dizzy after arriving to the hospital.  Denies pain except for his chronic pain in his lower back.  Pt says has history of kidney stones but denies feeling like he is having kidney stone pain.

## 2016-01-27 NOTE — ED Provider Notes (Signed)
[place] DEPT Provider Note   CSN: [id number] Arrival date & time: 01/27/16  1813     History   Chief Complaint Chief Complaint  Patient presents with  . Hematuria    HPI Samuel Long is a 70 y.o. male.   Hematuria   Pt presents to the ED with complaints of blood in his urine.  Initially the urine was pink in color but now it is dark red.  He did notice some small clots earlier.    No fever.  No pain.  Patient does have a history of BPH. He's had prostate surgery. Patient is concerned because in the past he had issues with urinary retention associated with outlet obstruction from clot formation / hematuria.  Past Medical History:  Diagnosis Date  . Arthritis   . BPH (benign prostatic hyperplasia)   . Cataracts, bilateral   . COPD (chronic obstructive pulmonary disease) ([place])   . Coronary atherosclerosis of native coronary artery    Single-vessel status post SVG to RCA May 2015  . Depression   . Essential hypertension, benign   . Left rib fracture   . PTSD (post-traumatic stress disorder)   . Spondylitis, ankylosing ([place])   . Thoracic aortic aneurysm [facility])    Status post repair May 2015    Patient Active Problem List   Diagnosis Date Noted  . Coronary atherosclerosis of native coronary artery 12/30/2013  . Ankylosing spondylitis ([place]) 12/14/2012  . Left ventricular hypertrophy 12/14/2012  . Essential hypertension, benign 12/14/2012  . Alcohol abuse 12/14/2012  . Thoracic aortic aneurysm ([place]) 12/14/2012    Past Surgical History:  Procedure Laterality Date  . BONE CYST EXCISION     Left foot, [name]  . CORONARY ARTERY BYPASS GRAFT N/A 08/22/2013   Procedure: CORONARY ARTERY BYPASS GRAFTING (CABG) TIMES ONE USING RIGHT THIGH GREATER SAPHENOUS VEIN HARVESTED ENDOSCOPICALLY;  Surgeon: Ivin Poot, MD;  Location: [place];  Service: Open Heart Surgery;  Laterality: N/A;  . INTRAOPERATIVE TRANSESOPHAGEAL ECHOCARDIOGRAM N/A 08/22/2013   Procedure: INTRAOPERATIVE  TRANSESOPHAGEAL ECHOCARDIOGRAM;  Surgeon: Ivin Poot, MD;  Location: [place];  Service: Open Heart Surgery;  Laterality: N/A;  . LEFT AND RIGHT HEART CATHETERIZATION WITH CORONARY ANGIOGRAM N/A 07/17/2013   Procedure: LEFT AND RIGHT HEART CATHETERIZATION WITH CORONARY ANGIOGRAM;  Surgeon: Peter M Martinique, MD;  Location: [facility] CATH LAB;  Service: Cardiovascular;  Laterality: N/A;  . REPLACEMENT ASCENDING AORTA N/A 08/22/2013   Procedure: REPLACEMENT ASCENDING AORTA with hemashield graft;  Surgeon: Ivin Poot, MD;  Location: [place];  Service: Open Heart Surgery;  Laterality: N/A;  . TRANSURETHRAL RESECTION OF PROSTATE  06/16/2011   Procedure: TRANSURETHRAL RESECTION OF THE PROSTATE (TURP);  Surgeon: Marissa Nestle, MD;  Location[facility];  Service: Urology;  Laterality: N/A;       Home Medications    Prior to Admission medications   Medication Sig Start Date End Date Taking? Authorizing Provider  amLODipine (NORVASC) 10 MG tablet Take 10 mg by mouth daily.   Yes Historical Provider, MD  aspirin 325 MG tablet Take 325 mg by mouth at bedtime.    Yes Historical Provider, MD  Flaxseed, Linseed, (FLAX SEED OIL) 1000 MG CAPS Take 2 capsules by mouth daily.   Yes Historical Provider, MD  HYDROcodone-acetaminophen (NORCO) 10-325 MG per tablet Take 1 tablet by mouth every 4 (four) hours as needed for moderate pain or severe pain.    Yes Historical Provider, MD  lisinopril (PRINIVIL,ZESTRIL) 40 MG tablet Take 40 mg  by mouth daily.   Yes Historical Provider, MD  Multiple Vitamin (MULITIVITAMIN WITH MINERALS) TABS Take 1 tablet by mouth daily. 06/21/11  Yes Enzo Montgomery, MD  Omega-3 Fatty Acids (FISH OIL) 1000 MG CAPS Take 2 capsules by mouth daily.   Yes Historical Provider, MD  sertraline (ZOLOFT) 100 MG tablet Take 100 mg by mouth daily.   Yes Historical Provider, MD  simvastatin (ZOCOR) 10 MG tablet Take 10 mg by mouth at bedtime.    Yes Historical Provider, MD  traZODone (DESYREL) 150 MG tablet Take  150 mg by mouth at bedtime.   Yes Historical Provider, MD    Family History Family History  Problem Relation Age of Onset  . Heart failure Mother   . Heart failure Father   . Heart disease Father   . Heart disease Paternal Uncle   . Anesthesia problems Neg Hx   . Hypotension Neg Hx   . Malignant hyperthermia Neg Hx   . Pseudochol deficiency Neg Hx     Social History Social History  Substance Use Topics  . Smoking status: Current Every Day Smoker    Packs/day: 1.00    Years: 40.00    Types: Cigarettes  . Smokeless tobacco: Never Used     Comment: smoking "a few, not as many as it used to be"  . Alcohol use 1.8 oz/week    1 Glasses of wine, 2 Cans of beer per week     Comment: 2-4 beers daily     Allergies   Indocin [indomethacin]   Review of Systems Review of Systems  Genitourinary: Positive for hematuria.  All other systems reviewed and are negative.    Physical Exam Updated Vital Signs BP 108/69 (BP Location: Left Arm)   Pulse 84   Temp 98.1 F (36.7 C) (Oral)   Resp 19   Ht 5\' 9"  (1.753 m)   Wt 76.7 kg   SpO2 94%   BMI 24.96 kg/m   Physical Exam  Constitutional: He appears well-developed and well-nourished. No distress.  HENT:  Head: Normocephalic and atraumatic.  Right Ear: External ear normal.  Left Ear: External ear normal.  Eyes: Conjunctivae are normal. Right eye exhibits no discharge. Left eye exhibits no discharge. No scleral icterus.  Neck: Neck supple. No tracheal deviation present.  Cardiovascular: Normal rate, regular rhythm and intact distal pulses.   Pulmonary/Chest: Effort normal and breath sounds normal. No stridor. No respiratory distress. He has no wheezes. He has no rales.  Abdominal: Soft. Bowel sounds are normal. He exhibits no distension. There is no tenderness. There is no rebound and no guarding.  Musculoskeletal: He exhibits no edema or tenderness.  Neurological: He is alert. He has normal strength. No cranial nerve deficit  (no facial droop, extraocular movements intact, no slurred speech) or sensory deficit. He exhibits normal muscle tone. He displays no seizure activity. Coordination normal.  Skin: Skin is warm and dry. No rash noted.  Psychiatric: He has a normal mood and affect.  Nursing note and vitals reviewed.    ED Treatments / Results  Labs (all labs ordered are listed, but only abnormal results are displayed) Labs Reviewed  URINALYSIS, ROUTINE W REFLEX MICROSCOPIC (NOT AT [facility]) - Abnormal; Notable for the following:       Result Value   Color, Urine RED (*)    APPearance HAZY (*)    Glucose, UA 100 (*)    Hgb urine dipstick LARGE (*)    Ketones, ur 15 (*)  Protein, ur >300 (*)    Nitrite POSITIVE (*)    Leukocytes, UA MODERATE (*)    All other components within normal limits  BASIC METABOLIC PANEL - Abnormal; Notable for the following:    Sodium 134 (*)    Chloride 99 (*)    Creatinine, Ser 0.60 (*)    All other components within normal limits  CBC - Abnormal; Notable for the following:    Hemoglobin 17.3 (*)    All other components within normal limits  URINE MICROSCOPIC-ADD ON - Abnormal; Notable for the following:    Squamous Epithelial / LPF 0-5 (*)    Bacteria, UA FEW (*)    All other components within normal limits   Procedures Procedures (including critical care time)  Medications Ordered in ED Medications - No data to display   Initial Impression / Assessment and Plan / ED Course  I have reviewed the triage vital signs and the nursing notes.  Pertinent labs & imaging results that were available during my care of the patient were reviewed by me and considered in my medical decision making (see chart for details).  Clinical Course    Patient is still able to urinate. No signs of obstruction. Laboratory tests are reassuring. He is not anemic.  We discussed continuing to hydrate. Contact urology office tomorrow to schedule close follow-up. Patient will likely need  cystoscopy and further evaluation to determine the cause of his hematuria.  Final Clinical Impressions(s) / ED Diagnoses   Final diagnoses:  Gross hematuria    New Prescriptions New Prescriptions   No medications on file     Dorie Rank, MD 01/27/16 2112

## 2016-01-28 ENCOUNTER — Encounter (HOSPITAL_COMMUNITY): Payer: Self-pay | Admitting: *Deleted

## 2016-01-28 ENCOUNTER — Observation Stay (HOSPITAL_COMMUNITY): Payer: Medicare Other

## 2016-01-28 ENCOUNTER — Observation Stay (HOSPITAL_COMMUNITY)
Admission: AD | Admit: 2016-01-28 | Discharge: 2016-01-29 | Disposition: A | Discharge status: Home / Self Care | Payer: Medicare Other | Source: Ambulatory Visit | Attending: Urology | Admitting: Urology

## 2016-01-28 DIAGNOSIS — Z7982 Long term (current) use of aspirin: Secondary | ICD-10-CM | POA: Insufficient documentation

## 2016-01-28 DIAGNOSIS — R310 Gross hematuria: Secondary | ICD-10-CM | POA: Diagnosis present

## 2016-01-28 DIAGNOSIS — Z951 Presence of aortocoronary bypass graft: Secondary | ICD-10-CM | POA: Insufficient documentation

## 2016-01-28 DIAGNOSIS — J449 Chronic obstructive pulmonary disease, unspecified: Secondary | ICD-10-CM | POA: Diagnosis not present

## 2016-01-28 DIAGNOSIS — Z79899 Other long term (current) drug therapy: Secondary | ICD-10-CM | POA: Insufficient documentation

## 2016-01-28 DIAGNOSIS — I10 Essential (primary) hypertension: Secondary | ICD-10-CM | POA: Diagnosis not present

## 2016-01-28 DIAGNOSIS — R338 Other retention of urine: Secondary | ICD-10-CM | POA: Insufficient documentation

## 2016-01-28 DIAGNOSIS — M1990 Unspecified osteoarthritis, unspecified site: Secondary | ICD-10-CM | POA: Diagnosis not present

## 2016-01-28 DIAGNOSIS — F17210 Nicotine dependence, cigarettes, uncomplicated: Secondary | ICD-10-CM | POA: Diagnosis not present

## 2016-01-28 DIAGNOSIS — I2510 Atherosclerotic heart disease of native coronary artery without angina pectoris: Secondary | ICD-10-CM | POA: Diagnosis not present

## 2016-01-28 DIAGNOSIS — N401 Enlarged prostate with lower urinary tract symptoms: Principal | ICD-10-CM | POA: Insufficient documentation

## 2016-01-28 LAB — BASIC METABOLIC PANEL WITH GFR
Anion gap: 6 (ref 5–15)
BUN: 6 mg/dL (ref 6–20)
CO2: 30 mmol/L (ref 22–32)
Calcium: 9 mg/dL (ref 8.9–10.3)
Chloride: 104 mmol/L (ref 101–111)
Creatinine, Ser: 0.5 mg/dL — ABNORMAL LOW (ref 0.61–1.24)
GFR calc Af Amer: >60 mL/min
GFR calc non Af Amer: >60 mL/min
Glucose, Bld: 77 mg/dL (ref 65–99)
Potassium: 3.8 mmol/L (ref 3.5–5.1)
Sodium: 140 mmol/L (ref 135–145)

## 2016-01-28 LAB — CBC
HCT: 43.6 % (ref 39.0–52.0)
Hemoglobin: 15.2 g/dL (ref 13.0–17.0)
MCH: 32.6 pg (ref 26.0–34.0)
MCHC: 34.9 g/dL (ref 30.0–36.0)
MCV: 93.6 fL (ref 78.0–100.0)
PLATELETS: 255 10*3/uL (ref 150–400)
RBC: 4.66 MIL/uL (ref 4.22–5.81)
RDW: 14.4 % (ref 11.5–15.5)
WBC: 6.8 10*3/uL (ref 4.0–10.5)

## 2016-01-28 MED ORDER — ONDANSETRON HCL 4 MG/2ML IJ SOLN: 4.0000 mg | INTRAMUSCULAR | Status: DC | PRN

## 2016-01-28 MED ORDER — HYDROMORPHONE HCL 1 MG/ML IJ SOLN
0.5000 mg | INTRAMUSCULAR | Status: DC | PRN
  Administered 2016-01-28 (×2): 1 mg via INTRAVENOUS
  Filled 2016-01-28 (×2): qty 1

## 2016-01-28 MED ORDER — DIPHENHYDRAMINE HCL 50 MG/ML IJ SOLN: 12.5000 mg | Freq: Four times a day (QID) | INTRAMUSCULAR | Status: DC | PRN

## 2016-01-28 MED ORDER — OXYCODONE-ACETAMINOPHEN 5-325 MG PO TABS
1.0000 | ORAL_TABLET | ORAL | Status: DC | PRN
  Administered 2016-01-29 (×3): 2 via ORAL
  Filled 2016-01-28 (×3): qty 2

## 2016-01-28 MED ORDER — INFLUENZA VAC SPLIT QUAD 0.5 ML IM SUSY
0.5000 mL | PREFILLED_SYRINGE | INTRAMUSCULAR | Status: AC
  Administered 2016-01-29: 0.5 mL via INTRAMUSCULAR
  Filled 2016-01-28: qty 0.5

## 2016-01-28 MED ORDER — DEXTROSE 5 % IV SOLN
2.0000 g | INTRAVENOUS | Status: DC
  Administered 2016-01-28 – 2016-01-29 (×2): 2 g via INTRAVENOUS
  Filled 2016-01-28 (×2): qty 2

## 2016-01-28 MED ORDER — SODIUM CHLORIDE 0.9 % IV SOLN
INTRAVENOUS | Status: DC
  Administered 2016-01-28 – 2016-01-29 (×2): via INTRAVENOUS

## 2016-01-28 MED ORDER — IOPAMIDOL (ISOVUE-300) INJECTION 61%
100.0000 mL | Freq: Once | INTRAVENOUS | Status: AC | PRN
  Administered 2016-01-28: 100 mL via INTRAVENOUS

## 2016-01-28 MED ORDER — DIPHENHYDRAMINE HCL 12.5 MG/5ML PO ELIX: 12.5000 mg | ORAL_SOLUTION | Freq: Four times a day (QID) | ORAL | Status: DC | PRN

## 2016-01-28 MED ORDER — ZOLPIDEM TARTRATE 5 MG PO TABS: 5.0000 mg | ORAL_TABLET | Freq: Every evening | ORAL | Status: DC | PRN

## 2016-01-28 NOTE — Progress Notes (Signed)
Unable to place 3 way 22" F Foley Catheter. Call placed to receive additional orders.

## 2016-01-29 DIAGNOSIS — N401 Enlarged prostate with lower urinary tract symptoms: Secondary | ICD-10-CM | POA: Diagnosis not present

## 2016-01-29 LAB — CBC
HEMATOCRIT: 44.7 % (ref 39.0–52.0)
Hemoglobin: 15.1 g/dL (ref 13.0–17.0)
MCH: 32.8 pg (ref 26.0–34.0)
MCHC: 33.8 g/dL (ref 30.0–36.0)
MCV: 97 fL (ref 78.0–100.0)
Platelets: 265 10*3/uL (ref 150–400)
RBC: 4.61 MIL/uL (ref 4.22–5.81)
RDW: 14.8 % (ref 11.5–15.5)
WBC: 11.1 10*3/uL — AB (ref 4.0–10.5)

## 2016-01-29 LAB — BASIC METABOLIC PANEL
ANION GAP: 8 (ref 5–15)
BUN: 9 mg/dL (ref 6–20)
CO2: 28 mmol/L (ref 22–32)
Calcium: 8.9 mg/dL (ref 8.9–10.3)
Chloride: 103 mmol/L (ref 101–111)
Creatinine, Ser: 0.63 mg/dL (ref 0.61–1.24)
GFR calc Af Amer: >60 mL/min (ref >60)
GLUCOSE: 133 mg/dL — AB (ref 65–99)
POTASSIUM: 3.8 mmol/L (ref 3.5–5.1)
Sodium: 139 mmol/L (ref 135–145)

## 2016-01-29 LAB — PSA: PSA: 6.85 ng/mL — ABNORMAL HIGH (ref 0.00–4.00)

## 2016-01-29 MED ORDER — CEFPODOXIME PROXETIL 200 MG PO TABS: 200.0000 mg | ORAL_TABLET | Freq: Two times a day (BID) | ORAL | 0 refills | Status: DC

## 2016-01-29 NOTE — Care Management Note (Signed)
Case Management Note  Patient Details  Name: Samuel Long MRN: [id number] Date of Birth: [birth year]  Subjective/Objective:  69 y/o m admitted w/Gross hematuria. From home.                  Action/Plan:d/c plan home.   Expected Discharge Date:                  Expected Discharge Plan:  Home/Self Care  In-House Referral:     Discharge planning Services  CM Consult  Post Acute Care Choice:    Choice offered to:     DME Arranged:    DME Agency:     HH Arranged:    HH Agency:     Status of Service:  In process, will continue to follow  If discussed at Long Length of Stay Meetings, dates discussed:    Additional Comments:  Dessa Phi, RN 01/29/2016, 3:19 PM

## 2016-01-29 NOTE — H&P (Signed)
Urology Admission H&P  Chief Complaint: Gross hematuria  History of Present Illness: Mr Hokama is a 70yo with a hx of BPH who presented to my office today with gross hematuria and clot retention. He has a hx of TURP 7-8 years ago by DR. Michela Pitcher. The gross hematuria started 3 days ago and has progressively worsened and he cannot urinate. He denies dysuria. He has associated urgency, frequency and a feeling of incomplete emptying. No exacerbating/alleviating events. No fevers. No have of UTI or prostate infection.  Past Medical History:  Diagnosis Date  . Arthritis   . BPH (benign prostatic hyperplasia)   . Cataracts, bilateral   . COPD (chronic obstructive pulmonary disease) ([place])   . Coronary atherosclerosis of native coronary artery    Single-vessel status post SVG to RCA May 2015  . Depression   . Essential hypertension, benign   . Left rib fracture   . PTSD (post-traumatic stress disorder)   . Spondylitis, ankylosing ([place])   . Thoracic aortic aneurysm [facility])    Status post repair May 2015   Past Surgical History:  Procedure Laterality Date  . BONE CYST EXCISION     Left foot, [name]  . CORONARY ARTERY BYPASS GRAFT N/A 08/22/2013   Procedure: CORONARY ARTERY BYPASS GRAFTING (CABG) TIMES ONE USING RIGHT THIGH GREATER SAPHENOUS VEIN HARVESTED ENDOSCOPICALLY;  Surgeon: Ivin Poot, MD;  Location: [place];  Service: Open Heart Surgery;  Laterality: N/A;  . INTRAOPERATIVE TRANSESOPHAGEAL ECHOCARDIOGRAM N/A 08/22/2013   Procedure: INTRAOPERATIVE TRANSESOPHAGEAL ECHOCARDIOGRAM;  Surgeon: Ivin Poot, MD;  Location: [place];  Service: Open Heart Surgery;  Laterality: N/A;  . LEFT AND RIGHT HEART CATHETERIZATION WITH CORONARY ANGIOGRAM N/A 07/17/2013   Procedure: LEFT AND RIGHT HEART CATHETERIZATION WITH CORONARY ANGIOGRAM;  Surgeon: Peter M [place]ique, MD;  Location: [facility] CATH LAB;  Service: Cardiovascular;  Laterality: N/A;  . REPLACEMENT ASCENDING AORTA N/A 08/22/2013   Procedure: REPLACEMENT  ASCENDING AORTA with hemashield graft;  Surgeon: Ivin Poot, MD;  Location: [place];  Service: Open Heart Surgery;  Laterality: N/A;  . TRANSURETHRAL RESECTION OF PROSTATE  06/16/2011   Procedure: TRANSURETHRAL RESECTION OF THE PROSTATE (TURP);  Surgeon: Marissa Nestle, MD;  Location: [facility];  Service: Urology;  Laterality: N/A;    Home Medications:  Prescriptions Prior to Admission  Medication Sig Dispense Refill Last Dose  . amLODipine (NORVASC) 10 MG tablet Take 10 mg by mouth daily.   01/27/2016 at Unknown time  . aspirin 325 MG tablet Take 325 mg by mouth at bedtime.    01/27/2016 at Unknown time  . Flaxseed, Linseed, (FLAX SEED OIL) 1000 MG CAPS Take 2 capsules by mouth daily.   01/27/2016 at Unknown time  . HYDROcodone-acetaminophen (NORCO) 10-325 MG per tablet Take 1 tablet by mouth every 4 (four) hours as needed for moderate pain or severe pain.    01/28/2016 at Unknown time  . lisinopril (PRINIVIL,ZESTRIL) 40 MG tablet Take 40 mg by mouth daily.   01/27/2016 at Unknown time  . Multiple Vitamin (MULITIVITAMIN WITH MINERALS) TABS Take 1 tablet by mouth daily.   01/27/2016 at Unknown time  . Omega-3 Fatty Acids (FISH OIL) 1000 MG CAPS Take 2 capsules by mouth daily.   01/27/2016 at Unknown time  . sertraline (ZOLOFT) 100 MG tablet Take 100 mg by mouth daily.   01/27/2016 at Unknown time  . simvastatin (ZOCOR) 10 MG tablet Take 10 mg by mouth at bedtime.    01/27/2016 at Unknown time  . traZODone (  DESYREL) 150 MG tablet Take 150 mg by mouth at bedtime.   01/27/2016 at Unknown time   Allergies:  Allergies  Allergen Reactions  . Indocin [Indomethacin] Other (See Comments)    Migrane    Family History  Problem Relation Age of Onset  . Heart failure Mother   . Heart failure Father   . Heart disease Father   . Heart disease Paternal Uncle   . Anesthesia problems Neg Hx   . Hypotension Neg Hx   . Malignant hyperthermia Neg Hx   . Pseudochol deficiency Neg Hx    Social  History:  reports that he has been smoking Cigarettes.  He has a 40.00 pack-year smoking history. He has never used smokeless tobacco. He reports that he drinks about 1.8 oz of alcohol per week . He reports that he does not use drugs.  Review of Systems  Genitourinary: Positive for frequency, hematuria and urgency.  All other systems reviewed and are negative.   Physical Exam:  Vital signs in last 24 hours: Temp:  [97.6 F (36.4 C)-98.8 F (37.1 C)] 97.6 F (36.4 C) (10/13 1433) Pulse Rate:  [80-101] 80 (10/13 1433) Resp:  [20] 20 (10/13 1433) BP: (113-131)/(71-89) 116/80 (10/13 1433) SpO2:  [95 %-98 %] 98 % (10/13 1433) Physical Exam  Constitutional: He is oriented to person, place, and time. He appears well-developed and well-nourished.  HENT:  Head: Normocephalic and atraumatic.  Eyes: EOM are normal. Pupils are equal, round, and reactive to light.  Neck: Normal range of motion. No thyromegaly present.  Cardiovascular: Normal rate and regular rhythm.   Respiratory: Effort normal. No respiratory distress.  GI: Soft. He exhibits no distension. Hernia confirmed negative in the right inguinal area and confirmed negative in the left inguinal area.  Genitourinary: Rectum normal, testes normal and penis normal. Rectal exam shows anal tone normal. Prostate is enlarged. Prostate is not tender. Right testis shows no mass, no swelling and no tenderness. Right testis is descended. Cremasteric reflex is not absent on the right side. Left testis shows no mass, no swelling and no tenderness. Left testis is descended. Cremasteric reflex is not absent on the left side. Circumcised. No penile erythema or penile tenderness. No discharge found.  Musculoskeletal: Normal range of motion. He exhibits no edema.  Lymphadenopathy:       Right: No inguinal adenopathy present.       Left: No inguinal adenopathy present.  Neurological: He is alert and oriented to person, place, and time.  Skin: Skin is warm  and dry.  Psychiatric: He has a normal mood and affect. His behavior is normal. Judgment and thought content normal.    Laboratory Data:  Results for orders placed or performed during the hospital encounter of 01/28/16 (from the past 24 hour(s))  CBC     Status: Abnormal   Collection Time: 01/29/16  5:41 AM  Result Value Ref Range   WBC 11.1 (H) 4.0 - 10.5 K/uL   RBC 4.61 4.22 - 5.81 MIL/uL   Hemoglobin 15.1 13.0 - 17.0 g/dL   HCT 44.7 39.0 - 52.0 %   MCV 97.0 78.0 - 100.0 fL   MCH 32.8 26.0 - 34.0 pg   MCHC 33.8 30.0 - 36.0 g/dL   RDW 14.8 11.5 - 15.5 %   Platelets 265 150 - 400 K/uL  Basic metabolic panel     Status: Abnormal   Collection Time: 01/29/16  5:41 AM  Result Value Ref Range   Sodium 139 135 -  145 mmol/L   Potassium 3.8 3.5 - 5.1 mmol/L   Chloride 103 101 - 111 mmol/L   CO2 28 22 - 32 mmol/L   Glucose, Bld 133 (H) 65 - 99 mg/dL   BUN 9 6 - 20 mg/dL   Creatinine, Ser 0.63 0.61 - 1.24 mg/dL   Calcium 8.9 8.9 - 10.3 mg/dL   GFR calc non Af Amer >60 >60 mL/min   GFR calc Af Amer >60 >60 mL/min   Anion gap 8 5 - 15  PSA     Status: Abnormal   Collection Time: 01/29/16  7:05 AM  Result Value Ref Range   PSA 6.85 (H) 0.00 - 4.00 ng/mL   No results found for this or any previous visit (from the past 240 hour(s)). Creatinine:  Recent Labs  01/27/16 1905 01/28/16 1516 01/29/16 0541  CREATININE 0.60* 0.50* 0.63   Baseline Creatinine: 0.6  Impression/Assessment:  70yo with BPH with LUTS, gross hematuria, clot retention  Plan:  1. 20 French coude placed and 100cc of clot irrigated from the bladder 2. Urine for culture and rocephin 2g started 3. BMP and will obtain CT urogram  Nicolette Bang 01/29/2016, 4:01 PM

## 2016-01-29 NOTE — Progress Notes (Signed)
Pt voided 350cc of red urine with one large clot. Bladder scan showed a PVR of 205. Called and spoke with Catalina Antigua, MD on call for urology, to see if foley needed to be replaced prior to discharge. He stated to give the patient more time to void and check another PVR and if the patient voids 50% or more of what the bladder scan shows then he can still proceed with discharge without a foley, but if the pt voids less than 50% of what bladder can shows the foley would need to be replaced. The patient voided again 550cc of red urine with one medium clot. Bladder scan performed and showed a PVR of 320cc. Patient felt that he could void again at that time and he voided 100cc and then the PVR was 220cc. Per discuss with MD patient was discharged without a foley catheter in place, but was told to come back to the ED or call the On Call MD with [facility] if problems arise. The patient stated that he felt comfortable and did not feel as though he was not emptying his bladder. The patient was having urinary frequency but this is not surprising given his enlarged prostate. (The MD had to place the 20Fr Coude catheter on 10/12 d/t difficulties r/t size of prostate.) If a foley catheter needs to be replaced, the Urology MD will need to place it per Dr. Alyson Ingles. The patient was given his AVS and prescription for antibiotic and instructed to begin taking the antibiotic tonight.

## 2016-01-29 NOTE — Care Management Obs Status (Signed)
[facility] NOTIFICATION   Patient Details  Name: DORSEL MONTEIRO MRN: [id number] Date of Birth: July 20, 1945   Medicare Observation Status Notification Given:  Yes    MahabirJuliann Pulse, RN 01/29/2016, 3:20 PM

## 2016-02-04 NOTE — Discharge Summary (Signed)
Physician Discharge Summary  Patient ID: Samuel Long MRN: [id number] DOB/AGE: [birth year] 70 y.o.  Admit date: 01/28/2016 Discharge date: 01/29/2016  Admission Diagnoses:  Discharge Diagnoses:  Active Problems:   Gross hematuria   Discharged Condition: good  Hospital Course: The patient had a foley placed and was started on IV rocephin for presumed UTI. On hospital day1 foley was removed after his urine cleared. Prior to discharge the pt was tolerating a regular diet, pain was controlled on PO pain meds, they were ambulating without difficulty, and they had normal bowel function.   Consults: None  Significant Diagnostic Studies: urine culture  Treatments: IV hydration and antibiotics: ceftriaxone  Discharge Exam: Blood pressure 116/80, pulse 80, temperature 97.6 F (36.4 C), temperature source Oral, resp. rate 20, height 5\' 9"  (1.753 m), weight 75.6 kg (166 lb 9.6 oz), SpO2 98 %. General appearance: alert, cooperative and appears stated age Head: Normocephalic, without obvious abnormality, atraumatic Eyes: conjunctivae/corneas clear. PERRL, EOM's intact. Fundi benign. Neck: no adenopathy, no carotid bruit, no JVD, supple, symmetrical, trachea midline and thyroid not enlarged, symmetric, no tenderness/mass/nodules Resp: clear to auscultation bilaterally Cardio: regular rate and rhythm, S1, S2 normal, no murmur, click, rub or gallop GI: soft, non-tender; bowel sounds normal; no masses,  no organomegaly Extremities: extremities normal, atraumatic, no cyanosis or edema Lymph nodes: Cervical, supraclavicular, and axillary nodes normal. Neurologic: Grossly normal  Disposition: 01-Home or Self Care     Medication List    TAKE these medications   amLODipine 10 MG tablet Commonly known as:  NORVASC Take 10 mg by mouth daily.   aspirin 325 MG tablet Take 325 mg by mouth at bedtime.   cefpodoxime 200 MG tablet Commonly known as:  VANTIN Take 1 tablet (200 mg total) by mouth  2 (two) times daily.   Fish Oil 1000 MG Caps Take 2 capsules by mouth daily.   Flax Seed Oil 1000 MG Caps Take 2 capsules by mouth daily.   HYDROcodone-acetaminophen 10-325 MG tablet Commonly known as:  NORCO Take 1 tablet by mouth every 4 (four) hours as needed for moderate pain or severe pain.   lisinopril 40 MG tablet Commonly known as:  PRINIVIL,ZESTRIL Take 40 mg by mouth daily.   multivitamin with minerals Tabs tablet Take 1 tablet by mouth daily.   sertraline 100 MG tablet Commonly known as:  ZOLOFT Take 100 mg by mouth daily.   simvastatin 10 MG tablet Commonly known as:  ZOCOR Take 10 mg by mouth at bedtime.   traZODone 150 MG tablet Commonly known as:  DESYREL Take 150 mg by mouth at bedtime.      Follow-up Information    Nicolette Bang, MD. Schedule an appointment as soon as possible for a visit in 2 week(s).   Specialty:  Urology Contact information: [place] [address] [phone]           Signed: Nicolette Bang 02/04/2016, 11:49 AM

## 2016-02-08 ENCOUNTER — Other Ambulatory Visit: Payer: Self-pay | Admitting: Urology

## 2016-03-08 ENCOUNTER — Encounter (HOSPITAL_COMMUNITY): Payer: Self-pay

## 2016-03-08 ENCOUNTER — Encounter (HOSPITAL_COMMUNITY)
Admission: RE | Admit: 2016-03-08 | Discharge: 2016-03-08 | Disposition: A | Discharge status: Home / Self Care | Payer: Medicare Other | Source: Ambulatory Visit | Attending: Urology | Admitting: Urology

## 2016-03-08 DIAGNOSIS — N400 Enlarged prostate without lower urinary tract symptoms: Secondary | ICD-10-CM | POA: Diagnosis not present

## 2016-03-08 DIAGNOSIS — I10 Essential (primary) hypertension: Secondary | ICD-10-CM | POA: Insufficient documentation

## 2016-03-08 DIAGNOSIS — Z01818 Encounter for other preprocedural examination: Secondary | ICD-10-CM | POA: Diagnosis present

## 2016-03-08 DIAGNOSIS — I498 Other specified cardiac arrhythmias: Secondary | ICD-10-CM | POA: Insufficient documentation

## 2016-03-08 HISTORY — DX: Unspecified hearing loss, unspecified ear: H91.90

## 2016-03-08 HISTORY — DX: Unspecified iridocyclitis: H20.9

## 2016-03-08 HISTORY — DX: Anxiety disorder, unspecified: F41.9

## 2016-03-08 HISTORY — DX: Personal history of urinary calculi: Z87.442

## 2016-03-08 HISTORY — DX: Malignant (primary) neoplasm, unspecified: C80.1

## 2016-03-08 LAB — COMPREHENSIVE METABOLIC PANEL
ALK PHOS: 60 U/L (ref 38–126)
ALT: 15 U/L — AB (ref 17–63)
ANION GAP: 8 (ref 5–15)
AST: 25 U/L (ref 15–41)
Albumin: 4.1 g/dL (ref 3.5–5.0)
BILIRUBIN TOTAL: 0.7 mg/dL (ref 0.3–1.2)
BUN: 7 mg/dL (ref 6–20)
CALCIUM: 9.3 mg/dL (ref 8.9–10.3)
CO2: 29 mmol/L (ref 22–32)
CREATININE: 0.65 mg/dL (ref 0.61–1.24)
Chloride: 101 mmol/L (ref 101–111)
GFR calc Af Amer: >60 mL/min (ref >60)
GFR calc non Af Amer: >60 mL/min (ref >60)
GLUCOSE: 86 mg/dL (ref 65–99)
Potassium: 5.1 mmol/L (ref 3.5–5.1)
Sodium: 138 mmol/L (ref 135–145)
TOTAL PROTEIN: 7.6 g/dL (ref 6.5–8.1)

## 2016-03-08 LAB — CBC
HCT: 45.9 % (ref 39.0–52.0)
HEMOGLOBIN: 15.8 g/dL (ref 13.0–17.0)
MCH: 32.8 pg (ref 26.0–34.0)
MCHC: 34.4 g/dL (ref 30.0–36.0)
MCV: 95.2 fL (ref 78.0–100.0)
Platelets: 290 10*3/uL (ref 150–400)
RBC: 4.82 MIL/uL (ref 4.22–5.81)
RDW: 14.1 % (ref 11.5–15.5)
WBC: 8.7 10*3/uL (ref 4.0–10.5)

## 2016-03-08 LAB — ABO/RH: ABO/RH(D): AB POS

## 2016-03-08 NOTE — Progress Notes (Signed)
Pt aware no smoking 24 hours prior to surgical procedure date

## 2016-03-08 NOTE — Patient Instructions (Signed)
XAYVIER MOCCIA  03/08/2016   Your procedure is scheduled on: Friday March 18, 2016  Report to [facility] Main  Entrance take [place]  elevators to 3rd floor to  [place] at 5: 30 AM.  Call this number if you have problems the morning of surgery [phone]   Remember: ONLY 1 PERSON MAY GO WITH YOU TO SHORT STAY TO GET  READY MORNING OF [place].  Do not eat food or drink liquids :After Midnight.     Take these medicines the morning of surgery with A SIP OF WATER: Amlodipine (Norvasc); May take hydrocodone-acetaminophen if needed; Sertraline (Zoloft)                                You may not have any metal on your body including hair pins and              piercings  Do not wear jewelry,  lotions, powders or colognes, deodorant                      Men may shave face and neck.   Do not bring valuables to the hospital. [place].  Contacts, dentures or bridgework may not be worn into surgery.  Leave suitcase in the car. After surgery it may be brought to your room.    Special Instructions: FOLLOW SURGEON'S INSTRUCTIONS IN REGARDS TO BOWEL PREPARATION PRIOR TO SURGICAL PROCEDURE DATE            _____________________________________________________________________             [facility] - Preparing for Surgery Before surgery, you can play an important role.  Because skin is not sterile, your skin needs to be as free of germs as possible.  You can reduce the number of germs on your skin by washing with CHG (chlorahexidine gluconate) soap before surgery.  CHG is an antiseptic cleaner which kills germs and bonds with the skin to continue killing germs even after washing. Please DO NOT use if you have an allergy to CHG or antibacterial soaps.  If your skin becomes reddened/irritated stop using the CHG and inform your nurse when you arrive at Short Stay. Do not shave (including legs and underarms) for at  least 48 hours prior to the first CHG shower.  You may shave your face/neck. Please follow these instructions carefully:  1.  Shower with CHG Soap the night before surgery and the  morning of Surgery.  2.  If you choose to wash your hair, wash your hair first as usual with your  normal  shampoo.  3.  After you shampoo, rinse your hair and body thoroughly to remove the  shampoo.                           4.  Use CHG as you would any other liquid soap.  You can apply chg directly  to the skin and wash                       Gently with a scrungie or clean washcloth.  5.  Apply the CHG Soap to your body ONLY FROM  THE NECK DOWN.   Do not use on face/ open                           Wound or open sores. Avoid contact with eyes, ears mouth and genitals (private parts).                       Wash face,  Genitals (private parts) with your normal soap.             6.  Wash thoroughly, paying special attention to the area where your surgery  will be performed.  7.  Thoroughly rinse your body with warm water from the neck down.  8.  DO NOT shower/wash with your normal soap after using and rinsing off  the CHG Soap.                9.  Pat yourself dry with a clean towel.            10.  Wear clean pajamas.            11.  Place clean sheets on your bed the night of your first shower and do not  sleep with pets. Day of Surgery : Do not apply any lotions/deodorants the morning of surgery.  Please wear clean clothes to the hospital/surgery center.  FAILURE TO FOLLOW THESE INSTRUCTIONS MAY RESULT IN THE CANCELLATION OF YOUR SURGERY PATIENT SIGNATURE_________________________________  NURSE SIGNATURE__________________________________  ________________________________________________________________________

## 2016-03-17 NOTE — Anesthesia Preprocedure Evaluation (Addendum)
Anesthesia Evaluation  Patient identified by MRN, date of birth, ID band Patient awake    Reviewed: Allergy & Precautions, NPO status , Patient's Chart, lab work & pertinent test results  Airway Mallampati: II  TM Distance: >3 FB Neck ROM: Full    Dental  (+) Teeth Intact, Missing,    Pulmonary COPD, Current Smoker,  Continues to smoke    breath sounds clear to auscultation       Cardiovascular hypertension, + CAD and + CABG   Rhythm:Regular Rate:Normal     Neuro/Psych    GI/Hepatic negative GI ROS, Neg liver ROS,   Endo/Other    Renal/GU negative Renal ROS     Musculoskeletal  (+) Arthritis , Ankylosing spondylitis , seen in pain clinic   Abdominal   Peds  Hematology   Anesthesia Other Findings   Reproductive/Obstetrics                           Anesthesia Physical Anesthesia Plan  ASA: III  Anesthesia Plan: General   Post-op Pain Management:    Induction: Intravenous  Airway Management Planned: Oral ETT  Additional Equipment:   Intra-op Plan: Delibrate Circulatory arrest per surgeon request  Post-operative Plan:   Informed Consent: I have reviewed the patients History and Physical, chart, labs and discussed the procedure including the risks, benefits and alternatives for the proposed anesthesia with the patient or authorized representative who has indicated his/her understanding and acceptance.   Dental advisory given  Plan Discussed with: CRNA  Anesthesia Plan Comments: (He cannot tell me if he has seen a cardiologist since the year of his surgery. Care has been in the [place] system)       Anesthesia Quick Evaluation

## 2016-03-18 ENCOUNTER — Inpatient Hospital Stay (HOSPITAL_COMMUNITY): Payer: Medicare Other | Admitting: Anesthesiology

## 2016-03-18 ENCOUNTER — Inpatient Hospital Stay (HOSPITAL_COMMUNITY): Payer: Medicare Other

## 2016-03-18 ENCOUNTER — Encounter (HOSPITAL_COMMUNITY)
Admission: RE | Disposition: A | Discharge status: Home / Self Care | Payer: Self-pay | Source: Ambulatory Visit | Attending: Urology

## 2016-03-18 ENCOUNTER — Encounter (HOSPITAL_COMMUNITY): Payer: Self-pay | Admitting: *Deleted

## 2016-03-18 ENCOUNTER — Inpatient Hospital Stay (HOSPITAL_COMMUNITY)
Admission: RE | Admit: 2016-03-18 | Discharge: 2016-03-20 | DRG: 707 | Disposition: A | Discharge status: Home / Self Care | Payer: Medicare Other | Source: Ambulatory Visit | Attending: Urology | Admitting: Urology

## 2016-03-18 DIAGNOSIS — R310 Gross hematuria: Secondary | ICD-10-CM | POA: Diagnosis present

## 2016-03-18 DIAGNOSIS — M1990 Unspecified osteoarthritis, unspecified site: Secondary | ICD-10-CM | POA: Diagnosis present

## 2016-03-18 DIAGNOSIS — D62 Acute posthemorrhagic anemia: Secondary | ICD-10-CM | POA: Diagnosis not present

## 2016-03-18 DIAGNOSIS — I10 Essential (primary) hypertension: Secondary | ICD-10-CM | POA: Diagnosis present

## 2016-03-18 DIAGNOSIS — F4310 Post-traumatic stress disorder, unspecified: Secondary | ICD-10-CM | POA: Diagnosis present

## 2016-03-18 DIAGNOSIS — Z951 Presence of aortocoronary bypass graft: Secondary | ICD-10-CM | POA: Diagnosis not present

## 2016-03-18 DIAGNOSIS — Z01818 Encounter for other preprocedural examination: Secondary | ICD-10-CM

## 2016-03-18 DIAGNOSIS — I2510 Atherosclerotic heart disease of native coronary artery without angina pectoris: Secondary | ICD-10-CM | POA: Diagnosis present

## 2016-03-18 DIAGNOSIS — F419 Anxiety disorder, unspecified: Secondary | ICD-10-CM | POA: Diagnosis present

## 2016-03-18 DIAGNOSIS — F329 Major depressive disorder, single episode, unspecified: Secondary | ICD-10-CM | POA: Diagnosis present

## 2016-03-18 DIAGNOSIS — N138 Other obstructive and reflux uropathy: Secondary | ICD-10-CM | POA: Diagnosis present

## 2016-03-18 DIAGNOSIS — H9190 Unspecified hearing loss, unspecified ear: Secondary | ICD-10-CM | POA: Diagnosis present

## 2016-03-18 DIAGNOSIS — F17210 Nicotine dependence, cigarettes, uncomplicated: Secondary | ICD-10-CM | POA: Diagnosis present

## 2016-03-18 DIAGNOSIS — Z85820 Personal history of malignant melanoma of skin: Secondary | ICD-10-CM

## 2016-03-18 DIAGNOSIS — H269 Unspecified cataract: Secondary | ICD-10-CM | POA: Diagnosis present

## 2016-03-18 DIAGNOSIS — J449 Chronic obstructive pulmonary disease, unspecified: Secondary | ICD-10-CM | POA: Diagnosis present

## 2016-03-18 DIAGNOSIS — Z7982 Long term (current) use of aspirin: Secondary | ICD-10-CM

## 2016-03-18 DIAGNOSIS — N401 Enlarged prostate with lower urinary tract symptoms: Principal | ICD-10-CM | POA: Diagnosis present

## 2016-03-18 DIAGNOSIS — Z79899 Other long term (current) drug therapy: Secondary | ICD-10-CM

## 2016-03-18 HISTORY — PX: XI ROBOTIC ASSISTED SIMPLE PROSTATECTOMY: SHX6713

## 2016-03-18 LAB — TYPE AND SCREEN
ABO/RH(D): AB POS
ANTIBODY SCREEN: NEGATIVE

## 2016-03-18 LAB — HEMOGLOBIN AND HEMATOCRIT, BLOOD
HEMATOCRIT: 46.6 % (ref 39.0–52.0)
Hemoglobin: 15.6 g/dL (ref 13.0–17.0)

## 2016-03-18 SURGERY — PROSTATECTOMY, SIMPLE, ROBOT-ASSISTED
Anesthesia: General

## 2016-03-18 MED ORDER — DEXAMETHASONE SODIUM PHOSPHATE 10 MG/ML IJ SOLN
INTRAMUSCULAR | Status: AC
Start: 2016-03-18 — End: 2016-03-18
  Filled 2016-03-18: qty 1

## 2016-03-18 MED ORDER — ROCURONIUM BROMIDE 50 MG/5ML IV SOSY
PREFILLED_SYRINGE | INTRAVENOUS | Status: AC
  Filled 2016-03-18: qty 5

## 2016-03-18 MED ORDER — PROPOFOL 10 MG/ML IV BOLUS
INTRAVENOUS | Status: AC
  Filled 2016-03-18: qty 20

## 2016-03-18 MED ORDER — ROCURONIUM BROMIDE 10 MG/ML (PF) SYRINGE
PREFILLED_SYRINGE | INTRAVENOUS | Status: DC | PRN
  Administered 2016-03-18 (×2): 10 mg via INTRAVENOUS
  Administered 2016-03-18: 50 mg via INTRAVENOUS
  Administered 2016-03-18: 20 mg via INTRAVENOUS

## 2016-03-18 MED ORDER — KETOROLAC TROMETHAMINE 30 MG/ML IJ SOLN
15.0000 mg | Freq: Four times a day (QID) | INTRAMUSCULAR | Status: AC
  Administered 2016-03-18 – 2016-03-19 (×6): 15 mg via INTRAVENOUS
  Filled 2016-03-18 (×5): qty 1

## 2016-03-18 MED ORDER — LIDOCAINE 2% (20 MG/ML) 5 ML SYRINGE
INTRAMUSCULAR | Status: AC
  Filled 2016-03-18: qty 5

## 2016-03-18 MED ORDER — SULFAMETHOXAZOLE-TRIMETHOPRIM 800-160 MG PO TABS: 1.0000 | ORAL_TABLET | Freq: Two times a day (BID) | ORAL | 0 refills | Status: AC

## 2016-03-18 MED ORDER — SUCCINYLCHOLINE CHLORIDE 200 MG/10ML IV SOSY
PREFILLED_SYRINGE | INTRAVENOUS | Status: DC | PRN
  Administered 2016-03-18: 120 mg via INTRAVENOUS

## 2016-03-18 MED ORDER — HYDROMORPHONE HCL 1 MG/ML IJ SOLN
INTRAMUSCULAR | Status: DC | PRN
Start: 2016-03-18 — End: 2016-03-18
  Administered 2016-03-18 (×2): 0.5 mg via INTRAVENOUS

## 2016-03-18 MED ORDER — ONDANSETRON HCL 4 MG/2ML IJ SOLN
INTRAMUSCULAR | Status: DC | PRN
  Administered 2016-03-18: 4 mg via INTRAVENOUS

## 2016-03-18 MED ORDER — ESMOLOL HCL 100 MG/10ML IV SOLN
INTRAVENOUS | Status: DC | PRN
  Administered 2016-03-18: 10 mg via INTRAVENOUS

## 2016-03-18 MED ORDER — MORPHINE SULFATE (PF) 2 MG/ML IV SOLN
2.0000 mg | INTRAVENOUS | Status: DC | PRN
  Administered 2016-03-18 (×5): 4 mg via INTRAVENOUS
  Administered 2016-03-19 (×2): 2 mg via INTRAVENOUS
  Filled 2016-03-18 (×7): qty 2

## 2016-03-18 MED ORDER — HYDROMORPHONE HCL 2 MG/ML IJ SOLN
INTRAMUSCULAR | Status: AC
  Filled 2016-03-18: qty 1

## 2016-03-18 MED ORDER — LACTATED RINGERS IV SOLN
INTRAVENOUS | Status: DC | PRN
  Administered 2016-03-18 (×2): via INTRAVENOUS

## 2016-03-18 MED ORDER — ACETAMINOPHEN 325 MG PO TABS: 650.0000 mg | ORAL_TABLET | ORAL | Status: DC | PRN

## 2016-03-18 MED ORDER — STERILE WATER FOR IRRIGATION IR SOLN
Status: DC | PRN
  Administered 2016-03-18: 1000 mL

## 2016-03-18 MED ORDER — AMLODIPINE BESYLATE 10 MG PO TABS
10.0000 mg | ORAL_TABLET | Freq: Every day | ORAL | Status: DC
  Administered 2016-03-19 – 2016-03-20 (×2): 10 mg via ORAL
  Filled 2016-03-18 (×2): qty 1

## 2016-03-18 MED ORDER — HYDROCODONE-ACETAMINOPHEN 10-325 MG PO TABS: 1.0000 | ORAL_TABLET | ORAL | 0 refills | Status: AC | PRN

## 2016-03-18 MED ORDER — HYDROMORPHONE HCL 1 MG/ML IJ SOLN
INTRAMUSCULAR | Status: AC
  Filled 2016-03-18: qty 1

## 2016-03-18 MED ORDER — LIDOCAINE 2% (20 MG/ML) 5 ML SYRINGE
INTRAMUSCULAR | Status: DC | PRN
  Administered 2016-03-18: 20 mg via INTRAVENOUS

## 2016-03-18 MED ORDER — MIDAZOLAM HCL 5 MG/5ML IJ SOLN
INTRAMUSCULAR | Status: DC | PRN
  Administered 2016-03-18: 2 mg via INTRAVENOUS

## 2016-03-18 MED ORDER — DIPHENHYDRAMINE HCL 50 MG/ML IJ SOLN: 12.5000 mg | Freq: Four times a day (QID) | INTRAMUSCULAR | Status: DC | PRN

## 2016-03-18 MED ORDER — LISINOPRIL 20 MG PO TABS
40.0000 mg | ORAL_TABLET | Freq: Every day | ORAL | Status: DC
  Administered 2016-03-19: 40 mg via ORAL
  Filled 2016-03-18 (×2): qty 2

## 2016-03-18 MED ORDER — ONDANSETRON HCL 4 MG/2ML IJ SOLN: 4.0000 mg | INTRAMUSCULAR | Status: DC | PRN

## 2016-03-18 MED ORDER — SODIUM CHLORIDE 0.9 % IV BOLUS (SEPSIS): 1000.0000 mL | Freq: Once | INTRAVENOUS | Status: DC

## 2016-03-18 MED ORDER — BUPIVACAINE LIPOSOME 1.3 % IJ SUSP
INTRAMUSCULAR | Status: AC
  Filled 2016-03-18: qty 20

## 2016-03-18 MED ORDER — CEFTRIAXONE SODIUM 1 G IJ SOLR
INTRAMUSCULAR | Status: AC
  Filled 2016-03-18: qty 10

## 2016-03-18 MED ORDER — DEXAMETHASONE SODIUM PHOSPHATE 10 MG/ML IJ SOLN
INTRAMUSCULAR | Status: DC | PRN
  Administered 2016-03-18: 10 mg via INTRAVENOUS

## 2016-03-18 MED ORDER — DIPHENHYDRAMINE HCL 12.5 MG/5ML PO ELIX: 12.5000 mg | ORAL_SOLUTION | Freq: Four times a day (QID) | ORAL | Status: DC | PRN

## 2016-03-18 MED ORDER — DEXTROSE 5 % IV SOLN
2.0000 g | INTRAVENOUS | Status: AC
  Administered 2016-03-18: 2 g via INTRAVENOUS

## 2016-03-18 MED ORDER — KETOROLAC TROMETHAMINE 30 MG/ML IJ SOLN
INTRAMUSCULAR | Status: AC
  Filled 2016-03-18: qty 1

## 2016-03-18 MED ORDER — ONDANSETRON HCL 4 MG/2ML IJ SOLN
INTRAMUSCULAR | Status: AC
  Filled 2016-03-18: qty 2

## 2016-03-18 MED ORDER — SERTRALINE HCL 50 MG PO TABS
100.0000 mg | ORAL_TABLET | Freq: Every day | ORAL | Status: DC
  Administered 2016-03-18 – 2016-03-20 (×3): 100 mg via ORAL
  Filled 2016-03-18 (×3): qty 2

## 2016-03-18 MED ORDER — SODIUM CHLORIDE 0.9 % IJ SOLN
INTRAMUSCULAR | Status: DC | PRN
  Administered 2016-03-18: 20 mL

## 2016-03-18 MED ORDER — HYDROMORPHONE HCL 1 MG/ML IJ SOLN
0.2500 mg | INTRAMUSCULAR | Status: DC | PRN
  Administered 2016-03-18 (×3): 0.5 mg via INTRAVENOUS
  Filled 2016-03-18: qty 0.5

## 2016-03-18 MED ORDER — DEXTROSE-NACL 5-0.45 % IV SOLN
INTRAVENOUS | Status: DC
  Administered 2016-03-18 – 2016-03-19 (×4): via INTRAVENOUS

## 2016-03-18 MED ORDER — SUGAMMADEX SODIUM 200 MG/2ML IV SOLN
INTRAVENOUS | Status: DC | PRN
  Administered 2016-03-18: 160 mg via INTRAVENOUS

## 2016-03-18 MED ORDER — EPHEDRINE 5 MG/ML INJ
INTRAVENOUS | Status: AC
Start: 2016-03-18 — End: 2016-03-18
  Filled 2016-03-18: qty 10

## 2016-03-18 MED ORDER — HYDROCODONE-ACETAMINOPHEN 5-325 MG PO TABS
1.0000 | ORAL_TABLET | ORAL | Status: DC | PRN
  Administered 2016-03-18 – 2016-03-19 (×2): 2 via ORAL
  Administered 2016-03-19: 1 via ORAL
  Administered 2016-03-19 – 2016-03-20 (×3): 2 via ORAL
  Filled 2016-03-18 (×6): qty 2

## 2016-03-18 MED ORDER — SUGAMMADEX SODIUM 200 MG/2ML IV SOLN
INTRAVENOUS | Status: AC
  Filled 2016-03-18: qty 2

## 2016-03-18 MED ORDER — SODIUM CHLORIDE 0.9 % IJ SOLN
INTRAMUSCULAR | Status: AC
  Filled 2016-03-18: qty 50

## 2016-03-18 MED ORDER — FENTANYL CITRATE (PF) 250 MCG/5ML IJ SOLN
INTRAMUSCULAR | Status: AC
  Filled 2016-03-18: qty 5

## 2016-03-18 MED ORDER — FENTANYL CITRATE (PF) 100 MCG/2ML IJ SOLN
INTRAMUSCULAR | Status: DC | PRN
  Administered 2016-03-18: 50 ug via INTRAVENOUS
  Administered 2016-03-18: 100 ug via INTRAVENOUS
  Administered 2016-03-18 (×2): 50 ug via INTRAVENOUS

## 2016-03-18 MED ORDER — ESMOLOL HCL 100 MG/10ML IV SOLN
INTRAVENOUS | Status: AC
  Filled 2016-03-18: qty 10

## 2016-03-18 MED ORDER — TRAZODONE HCL 50 MG PO TABS
150.0000 mg | ORAL_TABLET | Freq: Every day | ORAL | Status: DC
  Administered 2016-03-18 – 2016-03-19 (×2): 150 mg via ORAL
  Filled 2016-03-18 (×2): qty 3

## 2016-03-18 MED ORDER — MIDAZOLAM HCL 2 MG/2ML IJ SOLN
INTRAMUSCULAR | Status: AC
Start: 2016-03-18 — End: 2016-03-18
  Filled 2016-03-18: qty 2

## 2016-03-18 MED ORDER — SIMVASTATIN 20 MG PO TABS
10.0000 mg | ORAL_TABLET | Freq: Every day | ORAL | Status: DC
  Administered 2016-03-18 – 2016-03-19 (×2): 10 mg via ORAL
  Filled 2016-03-18 (×2): qty 1

## 2016-03-18 MED ORDER — PROPOFOL 10 MG/ML IV BOLUS
INTRAVENOUS | Status: DC | PRN
  Administered 2016-03-18: 150 mg via INTRAVENOUS

## 2016-03-18 MED ORDER — BUPIVACAINE LIPOSOME 1.3 % IJ SUSP
INTRAMUSCULAR | Status: DC | PRN
  Administered 2016-03-18: 20 mL

## 2016-03-18 MED ORDER — PROMETHAZINE HCL 25 MG/ML IJ SOLN: 6.2500 mg | INTRAMUSCULAR | Status: DC | PRN

## 2016-03-18 MED ORDER — LACTATED RINGERS IV SOLN: INTRAVENOUS | Status: DC

## 2016-03-18 MED ORDER — EPHEDRINE SULFATE-NACL 50-0.9 MG/10ML-% IV SOSY
PREFILLED_SYRINGE | INTRAVENOUS | Status: DC | PRN
  Administered 2016-03-18 (×3): 10 mg via INTRAVENOUS

## 2016-03-18 SURGICAL SUPPLY — 59 items
ADH SKN CLS APL DERMABOND .7 (GAUZE/BANDAGES/DRESSINGS) ×1
APPLICATOR COTTON TIP 6IN STRL (MISCELLANEOUS) ×3 IMPLANT
CATH FOLEY 2WAY SLVR 18FR 30CC (CATHETERS) ×3 IMPLANT
CATH FOLEY 3WAY  5CC 16FR (CATHETERS) ×2
CATH FOLEY 3WAY 5CC 16FR (CATHETERS) IMPLANT
CATH TIEMANN FOLEY 18FR 5CC (CATHETERS) ×3 IMPLANT
CHLORAPREP W/TINT 26ML (MISCELLANEOUS) ×3 IMPLANT
CLIP LIGATING HEM O LOK PURPLE (MISCELLANEOUS) IMPLANT
CLOTH BEACON ORANGE TIMEOUT ST (SAFETY) ×3 IMPLANT
COVER SURGICAL LIGHT HANDLE (MISCELLANEOUS) ×3 IMPLANT
COVER TIP SHEARS 8 DVNC (MISCELLANEOUS) ×1 IMPLANT
COVER TIP SHEARS 8MM DA VINCI (MISCELLANEOUS) ×2
CUTTER ECHEON FLEX ENDO 45 340 (ENDOMECHANICALS) IMPLANT
DECANTER SPIKE VIAL GLASS SM (MISCELLANEOUS) ×3 IMPLANT
DERMABOND ADVANCED (GAUZE/BANDAGES/DRESSINGS) ×2
DERMABOND ADVANCED .7 DNX12 (GAUZE/BANDAGES/DRESSINGS) ×1 IMPLANT
DRAPE ARM DVNC X/XI (DISPOSABLE) ×4 IMPLANT
DRAPE COLUMN DVNC XI (DISPOSABLE) ×1 IMPLANT
DRAPE DA VINCI XI ARM (DISPOSABLE) ×8
DRAPE DA VINCI XI COLUMN (DISPOSABLE) ×2
DRSG TEGADERM 4X4.75 (GAUZE/BANDAGES/DRESSINGS) ×8 IMPLANT
ELECT REM PT RETURN 9FT ADLT (ELECTROSURGICAL) ×3
ELECTRODE REM PT RTRN 9FT ADLT (ELECTROSURGICAL) ×1 IMPLANT
GAUZE SPONGE 2X2 8PLY STRL LF (GAUZE/BANDAGES/DRESSINGS) ×1 IMPLANT
GLOVE BIO SURGEON STRL SZ 6.5 (GLOVE) ×2 IMPLANT
GLOVE BIO SURGEON STRL SZ8 (GLOVE) ×3 IMPLANT
GLOVE BIO SURGEONS STRL SZ 6.5 (GLOVE) ×1
GLOVE BIOGEL PI IND STRL 8 (GLOVE) ×1 IMPLANT
GLOVE BIOGEL PI INDICATOR 8 (GLOVE) ×2
GOWN STRL REUS W/TWL LRG LVL3 (GOWN DISPOSABLE) ×6 IMPLANT
GOWN STRL REUS W/TWL LRG LVL4 (GOWN DISPOSABLE) ×9 IMPLANT
HOLDER FOLEY CATH W/STRAP (MISCELLANEOUS) ×3 IMPLANT
IRRIG SUCT STRYKERFLOW 2 WTIP (MISCELLANEOUS) ×3
IRRIGATION SUCT STRKRFLW 2 WTP (MISCELLANEOUS) ×1 IMPLANT
IV LACTATED RINGERS 1000ML (IV SOLUTION) ×3 IMPLANT
NDL INSUFFLATION 14GA 120MM (NEEDLE) ×1 IMPLANT
NEEDLE INSUFFLATION 14GA 120MM (NEEDLE) ×3 IMPLANT
PACK ROBOT UROLOGY CUSTOM (CUSTOM PROCEDURE TRAY) ×3 IMPLANT
PAD POSITIONING PINK XL (MISCELLANEOUS) IMPLANT
RELOAD GREEN ECHELON 45 (STAPLE) IMPLANT
SEAL CANN UNIV 5-8 DVNC XI (MISCELLANEOUS) ×4 IMPLANT
SEAL XI 5MM-8MM UNIVERSAL (MISCELLANEOUS) ×8
SHEET LAVH (DRAPES) IMPLANT
SOLUTION ELECTROLUBE (MISCELLANEOUS) ×3 IMPLANT
SPONGE GAUZE 2X2 STER 10/PKG (GAUZE/BANDAGES/DRESSINGS)
SPONGE LAP 4X18 X RAY DECT (DISPOSABLE) ×3 IMPLANT
SUT ETHILON 3 0 PS 1 (SUTURE) ×3 IMPLANT
SUT MNCRL AB 4-0 PS2 18 (SUTURE) ×6 IMPLANT
SUT PDS AB 1 CT1 27 (SUTURE) ×6 IMPLANT
SUT VIC AB 0 CT1 36 (SUTURE) ×2 IMPLANT
SUT VIC AB 2-0 SH 27 (SUTURE) ×3
SUT VIC AB 2-0 SH 27X BRD (SUTURE) ×1 IMPLANT
SUT VICRYL 0 UR6 27IN ABS (SUTURE) ×3 IMPLANT
SUT VLOC BARB 180 ABS3/0GR12 (SUTURE)
SUTURE VLOC BRB 180 ABS3/0GR12 (SUTURE) IMPLANT
TOWEL OR 17X26 10 PK STRL BLUE (TOWEL DISPOSABLE) ×3 IMPLANT
TOWEL OR NON WOVEN STRL DISP B (DISPOSABLE) ×3 IMPLANT
WATER STERILE IRR 1000ML POUR (IV SOLUTION) ×2 IMPLANT
WATER STERILE IRR 1500ML POUR (IV SOLUTION) ×2 IMPLANT

## 2016-03-18 NOTE — Discharge Instructions (Signed)

## 2016-03-18 NOTE — Anesthesia Procedure Notes (Signed)
Procedure Name: Intubation Date/Time: 03/18/2016 8:04 AM Performed by: Lajuana Carry E Pre-anesthesia Checklist: Patient identified, Emergency Drugs available, Suction available and Patient being monitored Patient Re-evaluated:Patient Re-evaluated prior to inductionOxygen Delivery Method: Circle system utilized Preoxygenation: Pre-oxygenation with 100% oxygen Intubation Type: IV induction Ventilation: Mask ventilation without difficulty Laryngoscope Size: Miller and 3 Grade View: Grade I Tube type: Oral Tube size: 7.5 mm Number of attempts: 1 Airway Equipment and Method: Stylet Placement Confirmation: ETT inserted through vocal cords under direct vision,  positive ETCO2 and breath sounds checked- equal and bilateral Secured at: 23 cm Tube secured with: Tape Dental Injury: Teeth and Oropharynx as per pre-operative assessment

## 2016-03-18 NOTE — H&P (Signed)
Urology Admission H&P  Chief Complaint: gross hematuria  History of Present Illness: Samuel Long is a 70yo with a hx of BPH s/p TURP over 10 years ago who has had recurrent gross hematuria for 2 months and 1 documented UTI.  PSA was elevated but pt had a negative prostate biopsy.  Past Medical History:  Diagnosis Date  . Anxiety   . Arthritis   . BPH (benign prostatic hyperplasia)   . Cancer ([place])    melanoma - chest and scalp; also has had basal cell   . Cataracts, bilateral   . COPD (chronic obstructive pulmonary disease) ([place])   . Coronary atherosclerosis of native coronary artery    Single-vessel status post SVG to RCA May 2015  . Depression   . Essential hypertension, benign   . History of kidney stones   . HOH (hard of hearing)   . Iritis   . Left rib fracture   . PTSD (post-traumatic stress disorder)   . Spondylitis, ankylosing ([place])   . Thoracic aortic aneurysm [facility])    Status post repair May 2015   Past Surgical History:  Procedure Laterality Date  . BONE CYST EXCISION     Left foot, [name]  . CORONARY ARTERY BYPASS GRAFT N/A 08/22/2013   Procedure: CORONARY ARTERY BYPASS GRAFTING (CABG) TIMES ONE USING RIGHT THIGH GREATER SAPHENOUS VEIN HARVESTED ENDOSCOPICALLY;  Surgeon: Ivin Poot, MD;  Location: [place];  Service: Open Heart Surgery;  Laterality: N/A;  . INTRAOPERATIVE TRANSESOPHAGEAL ECHOCARDIOGRAM N/A 08/22/2013   Procedure: INTRAOPERATIVE TRANSESOPHAGEAL ECHOCARDIOGRAM;  Surgeon: Ivin Poot, MD;  Location: [place];  Service: Open Heart Surgery;  Laterality: N/A;  . LEFT AND RIGHT HEART CATHETERIZATION WITH CORONARY ANGIOGRAM N/A 07/17/2013   Procedure: LEFT AND RIGHT HEART CATHETERIZATION WITH CORONARY ANGIOGRAM;  Surgeon: Peter M Martinique, MD;  Location: [facility] CATH LAB;  Service: Cardiovascular;  Laterality: N/A;  . REPLACEMENT ASCENDING AORTA N/A 08/22/2013   Procedure: REPLACEMENT ASCENDING AORTA with hemashield graft;  Surgeon: Ivin Poot, MD;  Location: [place];   Service: Open Heart Surgery;  Laterality: N/A;  . TRANSURETHRAL RESECTION OF PROSTATE  06/16/2011   Procedure: TRANSURETHRAL RESECTION OF THE PROSTATE (TURP);  Surgeon: Marissa Nestle, MD;  Location: [facility];  Service: Urology;  Laterality: N/A;    Home Medications:  Prescriptions Prior to Admission  Medication Sig Dispense Refill Last Dose  . amLODipine (NORVASC) 10 MG tablet Take 10 mg by mouth daily.   03/18/2016 at 0400  . aspirin 325 MG tablet Take 325 mg by mouth at bedtime.    01/27/2016 at Unknown time  . Flaxseed, Linseed, (FLAX SEED OIL) 1000 MG CAPS Take 2,000 mg by mouth daily.    01/27/2016 at Unknown time  . HYDROcodone-acetaminophen (NORCO) 10-325 MG per tablet Take 1 tablet by mouth every 4 (four) hours as needed for moderate pain or severe pain.    03/18/2016 at 00530  . lisinopril (PRINIVIL,ZESTRIL) 40 MG tablet Take 40 mg by mouth at bedtime.    03/17/2016 at Unknown time  . Multiple Vitamin (MULITIVITAMIN WITH MINERALS) TABS Take 1 tablet by mouth daily.   01/27/2016 at Unknown time  . Omega-3 Fatty Acids (FISH OIL) 1000 MG CAPS Take 2,000 mg by mouth daily.    01/27/2016 at Unknown time  . sertraline (ZOLOFT) 100 MG tablet Take 100 mg by mouth daily.   03/17/2016 at Unknown time  . simvastatin (ZOCOR) 10 MG tablet Take 10 mg by mouth at bedtime.  03/17/2016 at Unknown time  . traZODone (DESYREL) 150 MG tablet Take 150 mg by mouth at bedtime.   03/17/2016 at Unknown time  . cefpodoxime (VANTIN) 200 MG tablet Take 1 tablet (200 mg total) by mouth 2 (two) times daily. (Patient not taking: Reported on 03/02/2016) 28 tablet 0 Completed Course at Unknown time   Allergies:  Allergies  Allergen Reactions  . Indocin [Indomethacin] Other (See Comments)    Migrane    Family History  Problem Relation Age of Onset  . Heart failure Mother   . Heart failure Father   . Heart disease Father   . Heart disease Paternal Uncle   . Anesthesia problems Neg Hx   . Hypotension Neg Hx    . Malignant hyperthermia Neg Hx   . Pseudochol deficiency Neg Hx    Social History:  reports that he has been smoking Cigarettes.  He has a 40.00 pack-year smoking history. He has never used smokeless tobacco. He reports that he drinks about 1.8 oz of alcohol per week . He reports that he does not use drugs.  Review of Systems  All other systems reviewed and are negative.   Physical Exam:  Vital signs in last 24 hours: Temp:  [98.1 F (36.7 C)] 98.1 F (36.7 C) (12/01 0544) Pulse Rate:  [78] 78 (12/01 0544) Resp:  [18] 18 (12/01 0544) BP: (129)/(80) 129/80 (12/01 0544) SpO2:  [96 %] 96 % (12/01 0544) Weight:  [77.6 kg (171 lb)] 77.6 kg (171 lb) (12/01 0602) Physical Exam  Constitutional: He is oriented to person, place, and time. He appears well-developed and well-nourished.  HENT:  Head: Normocephalic and atraumatic.  Eyes: EOM are normal. Pupils are equal, round, and reactive to light.  Neck: Normal range of motion. No thyromegaly present.  Cardiovascular: Normal rate and regular rhythm.   Respiratory: Effort normal. No respiratory distress.  GI: Soft. He exhibits no distension.  Musculoskeletal: Normal range of motion. He exhibits no edema.  Neurological: He is alert and oriented to person, place, and time.  Skin: Skin is warm and dry.  Psychiatric: He has a normal mood and affect. His behavior is normal. Judgment and thought content normal.    Laboratory Data:  No results found for this or any previous visit (from the past 24 hour(s)). No results found for this or any previous visit (from the past 240 hour(s)). Creatinine: No results for input(s): CREATININE in the last 168 hours. Baseline Creatinine: uknwon  Impression/Assessment:  [phone] with BPH with LUTS, gross hematuria  Plan:  The risks/benefits/alternatives to robotic simple prostatectomy was explained to the patient and he understands and wishes to proceed with surgery  Nicolette Bang 03/18/2016, 7:49 AM

## 2016-03-18 NOTE — Transfer of Care (Signed)
Immediate Anesthesia Transfer of Care Note  Patient: Samuel Long  Procedure(s) Performed: Procedure(s): XI ROBOTIC ASSISTED SIMPLE PROSTATECTOMY (N/A)  Patient Location: PACU  Anesthesia Type:General  Level of Consciousness:  sedated, patient cooperative and responds to stimulation  Airway & Oxygen Therapy:Patient Spontanous Breathing and Patient connected to face mask oxgen  Post-op Assessment:  Report given to PACU RN and Post -op Vital signs reviewed and stable  Post vital signs:  Reviewed and stable  Last Vitals:  Vitals:   03/18/16 0544  BP: 129/80  Pulse: 78  Resp: 18  Temp: [id number] C    Complications: No apparent anesthesia complications

## 2016-03-18 NOTE — Brief Op Note (Signed)
03/18/2016  11:41 AM  PATIENT:  Devra Dopp  70 y.o. male  PRE-OPERATIVE DIAGNOSIS:  BENIGN PROSTATIC HYPERPLASIA  POST-OPERATIVE DIAGNOSIS:  BENIGN PROSTATIC HYPERPLASIA  PROCEDURE:  Procedure(s): XI ROBOTIC ASSISTED SIMPLE PROSTATECTOMY (N/A)  SURGEON:  Surgeon(s) and Role:    * Cleon Gustin, MD - Primary  PHYSICIAN ASSISTANT: Debbrah Alar, MA  Resident: Lorayne Bender, MD  ANESTHESIA:   general  EBL:  Total I/O In: -  Out: 100 [Blood:100]  BLOOD ADMINISTERED:none  DRAINS: (1) Jackson-Pratt drain(s) with closed bulb suction in the left lower quadrant and Urinary Catheter (Foley)   LOCAL MEDICATIONS USED:  NONE  SPECIMEN:  Source of Specimen:  prostate adenoma  DISPOSITION OF SPECIMEN:  PATHOLOGY  COUNTS:  YES  TOURNIQUET:  * No tourniquets in log *  DICTATION: .Note written in EPIC  PLAN OF CARE: Admit to inpatient   PATIENT DISPOSITION:  PACU - hemodynamically stable.   Delay start of Pharmacological VTE agent (>24hrs) due to surgical blood loss or risk of bleeding: not applicable

## 2016-03-18 NOTE — Anesthesia Postprocedure Evaluation (Signed)
Anesthesia Post Note  Patient: Samuel Long  Procedure(s) Performed: Procedure(s) (LRB): XI ROBOTIC ASSISTED SIMPLE PROSTATECTOMY (N/A)  Patient location during evaluation: PACU Anesthesia Type: General Level of consciousness: awake and alert Pain management: pain level controlled Vital Signs Assessment: post-procedure vital signs reviewed and stable Respiratory status: spontaneous breathing, nonlabored ventilation, respiratory function stable and patient connected to nasal cannula oxygen Cardiovascular status: blood pressure returned to baseline and stable Postop Assessment: no signs of nausea or vomiting Anesthetic complications: no    Last Vitals:  Vitals:   03/18/16 1230 03/18/16 1245  BP: (!) 133/96 137/86  Pulse: (!) 102 (!) 109  Resp: (!) 9 12  Temp:      Last Pain:  Vitals:   03/18/16 1245  TempSrc:   PainSc: Asleep                 Taccara Bushnell,JAMES TERRILL

## 2016-03-19 LAB — BASIC METABOLIC PANEL
Anion gap: 7 (ref 5–15)
BUN: 9 mg/dL (ref 6–20)
CHLORIDE: 97 mmol/L — AB (ref 101–111)
CO2: 30 mmol/L (ref 22–32)
CREATININE: 0.58 mg/dL — AB (ref 0.61–1.24)
Calcium: 8.4 mg/dL — ABNORMAL LOW (ref 8.9–10.3)
Glucose, Bld: 109 mg/dL — ABNORMAL HIGH (ref 65–99)
Potassium: 4.8 mmol/L (ref 3.5–5.1)
SODIUM: 134 mmol/L — AB (ref 135–145)

## 2016-03-19 LAB — HEMOGLOBIN AND HEMATOCRIT, BLOOD
HEMATOCRIT: 37.3 % — AB (ref 39.0–52.0)
HEMOGLOBIN: 12.5 g/dL — AB (ref 13.0–17.0)

## 2016-03-19 NOTE — Progress Notes (Signed)
1 Day Post-Op   Assessment and Plan: 1. Doing well post op but had to be irrigated last night.  JP output low and will be removed.  Anticipate DC tomorrow. 2. Diet advanced.  3. Mild acute blood loss anemia.  Subjective: He is doing well post simple prostatectomy but required irrigation in addition to the CBI last night.  Minimal JP drainage noted.  He has some pain from the right lateral port site with movement but no other complaints.   Hgb is down to 12.5 from 15.6.  ROS:  Review of Systems  Constitutional: Negative for chills and fever.  Gastrointestinal: Negative for nausea and vomiting.    Anti-infectives: Anti-infectives    Start     Dose/Rate Route Frequency Ordered Stop   03/18/16 0549  cefTRIAXone (ROCEPHIN) 2 g in dextrose 5 % 50 mL IVPB     2 g 100 mL/hr over 30 Minutes Intravenous 30 min pre-op 03/18/16 0549 03/18/16 0830   03/18/16 0000  sulfamethoxazole-trimethoprim (BACTRIM DS,SEPTRA DS) 800-160 MG tablet     1 tablet Oral 2 times daily 03/18/16 0756        Current Facility-Administered Medications  Medication Dose Route Frequency Provider Last Rate Last Dose  . acetaminophen (TYLENOL) tablet 650 mg  650 mg Oral Q4H PRN Debbrah Alar, PA-C      . amLODipine (NORVASC) tablet 10 mg  10 mg Oral Daily Debbrah Alar, PA-C   10 mg at 03/19/16 0835  . dextrose 5 %-0.45 % sodium chloride infusion   Intravenous Continuous Debbrah Alar, PA-C 100 mL/hr at 03/19/16 [id number]    . diphenhydrAMINE (BENADRYL) injection 12.5 mg  12.5 mg Intravenous Q6H PRN Debbrah Alar, PA-C       Or  . diphenhydrAMINE (BENADRYL) 12.5 MG/5ML elixir 12.5 mg  12.5 mg Oral Q6H PRN Debbrah Alar, PA-C      . HYDROcodone-acetaminophen (NORCO/VICODIN) 5-325 MG per tablet 1-2 tablet  1-2 tablet Oral Q4H PRN Debbrah Alar, PA-C   2 tablet at 03/19/16 0445  . ketorolac (TORADOL) 30 MG/ML injection 15 mg  15 mg Intravenous Q6H Amanda Dancy, PA-C   15 mg at 03/19/16 0504  . lisinopril (PRINIVIL,ZESTRIL) tablet 40 mg   40 mg Oral QHS Amanda Dancy, PA-C      . morphine 2 MG/ML injection 2-4 mg  2-4 mg Intravenous Q2H PRN Debbrah Alar, PA-C   2 mg at 03/19/16 0834  . ondansetron (ZOFRAN) injection 4 mg  4 mg Intravenous Q4H PRN Debbrah Alar, PA-C      . sertraline (ZOLOFT) tablet 100 mg  100 mg Oral Daily Debbrah Alar, PA-C   100 mg at 03/19/16 0835  . simvastatin (ZOCOR) tablet 10 mg  10 mg Oral QHS Debbrah Alar, PA-C   10 mg at 03/18/16 2107  . traZODone (DESYREL) tablet 150 mg  150 mg Oral QHS Debbrah Alar, PA-C   150 mg at 03/18/16 2107     Objective: Vital signs in last 24 hours: Temp:  [97.9 F (36.6 C)-98.9 F (37.2 C)] 98 F (36.7 C) (12/02 0440) Pulse Rate:  [64-111] 64 (12/02 0440) Resp:  [9-17] 16 (12/02 0440) BP: (103-145)/(72-96) 111/72 (12/02 0440) SpO2:  [94 %-100 %] 95 % (12/02 0440) FiO2 (%):  [10 %] 10 % (12/01 1200)  Intake/Output from previous day: 12/01 0701 - 12/02 0700 In: 25046.7 [P.O.:670; I.V.:2226.7; IV Piggyback:50] Out: [phone] [Urine:27500; Drains:45; Blood:100] Intake/Output this shift: Total I/O In: 3000 [Other:3000] Out: 2400 [Urine:2400]   Physical Exam  Constitutional: He  is well-developed, well-nourished, and in no distress. No distress.  Cardiovascular: Normal rate, regular rhythm and normal heart sounds.   Pulmonary/Chest: Effort normal and breath sounds normal. No respiratory distress.  Abdominal: Soft. Bowel sounds are normal. He exhibits no distension. There is no tenderness.  Genitourinary:  Genitourinary Comments: Foley draining clear urine.   Vitals reviewed.   Lab Results:   Recent Labs  03/18/16 1207 03/19/16 0540  HGB 15.6 12.5*  HCT 46.6 37.3*   BMET  Recent Labs  03/19/16 0540  NA 134*  K 4.8  CL 97*  CO2 30  GLUCOSE 109*  BUN 9  CREATININE 0.58*  CALCIUM 8.4*   PT/INR No results for input(s): LABPROT, INR in the last 72 hours. ABG No results for input(s): PHART, HCO3 in the last 72 hours.  Invalid input(s): PCO2,  PO2  Studies/Results: Dg Chest 1 View  Result Date: 03/18/2016 CLINICAL DATA:  70 year old male preoperative study. Initial encounter. Personal history of thoracic aortic aneurysm surgical repair. EXAM: CHEST 1 VIEW COMPARISON:  Chest CTA 07/30/2014 and earlier. FINDINGS: PA view of the chest. Stable lung volumes. Mediastinal contours are stable and within normal limits. Sequelae of median sternotomy and CABG. Visualized tracheal air column is within normal limits. Stable surgical clips at the right axilla. Mild pulmonary interstitial markings appear stable. No pneumothorax, pulmonary edema, pleural effusion or acute pulmonary opacity. IMPRESSION: No acute cardiopulmonary abnormality. Electronically Signed   By: Genevie Ann M.D.   On: 03/18/2016 07:54            LOS: 1 day    Samuel Long 03/19/2016 [id number] ID: Samuel Long, male   DOB: 15-May-1945, 70 y.o.   MRN: [id number]

## 2016-03-20 NOTE — Care Management Note (Signed)
Case Management Note  Patient Details  Name: Samuel Long MRN: [id number] Date of Birth: [birth year]  Subjective/Objective:    XI robotic assisted Simple Prostatectomy on 03/18/2016               Action/Plan: Discharge Planning: AVS reviewed:  Chart reviewed for NCM needs. None identified.   PCP Redmond School MD  Expected Discharge Date:  03/20/2016              Expected Discharge Plan:  Home/Self Care  In-House Referral:  NA  Discharge planning Services  CM Consult  Post Acute Care Choice:  NA Choice offered to:  NA  DME Arranged:  N/A DME Agency:  NA  HH Arranged:  NA HH Agency:  NA  Status of Service:  Completed, signed off  If discussed at [place] of Stay Meetings, dates discussed:    Additional Comments:  Erenest Rasher, RN 03/20/2016, 9:32 AM

## 2016-03-20 NOTE — Discharge Summary (Signed)
Physician Discharge Summary  Patient ID: Samuel Long MRN: [id number] DOB/AGE: 69-Jul-1947 69 y.o.  Admit date: 03/18/2016 Discharge date: 03/20/2016  Admission Diagnoses:  BPH with urinary obstruction  Discharge Diagnoses:  Principal Problem:   BPH with urinary obstruction   Past Medical History:  Diagnosis Date  . Anxiety   . Arthritis   . BPH (benign prostatic hyperplasia)   . Cancer ([place])    melanoma - chest and scalp; also has had basal cell   . Cataracts, bilateral   . COPD (chronic obstructive pulmonary disease) ([place])   . Coronary atherosclerosis of native coronary artery    Single-vessel status post SVG to RCA May 2015  . Depression   . Essential hypertension, benign   . History of kidney stones   . HOH (hard of hearing)   . Iritis   . Left rib fracture   . PTSD (post-traumatic stress disorder)   . Spondylitis, ankylosing ([place])   . Thoracic aortic aneurysm [facility])    Status post repair May 2015    Surgeries: Procedure(s): XI ROBOTIC ASSISTED SIMPLE PROSTATECTOMY on 03/18/2016   Consultants (if any):   Discharged Condition: Improved  Hospital Course: Samuel Long is an 70 y.o. male who was admitted 03/18/2016 with a diagnosis of BPH with urinary obstruction and went to the operating room on 03/18/2016 and underwent the above named procedures.  His drain was removed on 12/2.   He is doing well but does have some pain with movement in the right lateral port site.   His urine is clear.    He was given perioperative antibiotics:  Anti-infectives    Start     Dose/Rate Route Frequency Ordered Stop   03/18/16 0549  cefTRIAXone (ROCEPHIN) 2 g in dextrose 5 % 50 mL IVPB     2 g 100 mL/hr over 30 Minutes Intravenous 30 min pre-op 03/18/16 0549 03/18/16 0830   03/18/16 0000  sulfamethoxazole-trimethoprim (BACTRIM DS,SEPTRA DS) 800-160 MG tablet     1 tablet Oral 2 times daily 03/18/16 0756      .  He was given sequential compression devices  for DVT prophylaxis.  He  benefited maximally from the hospital stay and there were no complications.    Recent vital signs:  Vitals:   03/19/16 2135 03/20/16 0513  BP: 118/87 139/89  Pulse: 72 60  Resp: 16 16  Temp: 98.5 F (36.9 C) 98.1 F (36.7 C)    Recent laboratory studies:  Lab Results  Component Value Date   HGB 12.5 (L) 03/19/2016   HGB 15.6 03/18/2016   HGB 15.8 03/08/2016   Lab Results  Component Value Date   WBC 8.7 03/08/2016   PLT 290 03/08/2016   Lab Results  Component Value Date   INR 1.90 (H) 08/22/2013   Lab Results  Component Value Date   NA 134 (L) 03/19/2016   K 4.8 03/19/2016   CL 97 (L) 03/19/2016   CO2 30 03/19/2016   BUN 9 03/19/2016   CREATININE 0.58 (L) 03/19/2016   GLUCOSE 109 (H) 03/19/2016    Discharge Medications:     Medication List    STOP taking these medications   aspirin 325 MG tablet   cefpodoxime 200 MG tablet Commonly known as:  VANTIN   Fish Oil 1000 MG Caps   Flax Seed Oil 1000 MG Caps   multivitamin with minerals Tabs tablet     TAKE these medications   amLODipine 10 MG tablet Commonly known as:  NORVASC  Take 10 mg by mouth daily.   HYDROcodone-acetaminophen 10-325 MG tablet Commonly known as:  NORCO Take 1 tablet by mouth every 4 (four) hours as needed for moderate pain or severe pain.   lisinopril 40 MG tablet Commonly known as:  PRINIVIL,ZESTRIL Take 40 mg by mouth at bedtime.   sertraline 100 MG tablet Commonly known as:  ZOLOFT Take 100 mg by mouth daily.   simvastatin 10 MG tablet Commonly known as:  ZOCOR Take 10 mg by mouth at bedtime.   sulfamethoxazole-trimethoprim 800-160 MG tablet Commonly known as:  BACTRIM DS,SEPTRA DS Take 1 tablet by mouth 2 (two) times daily. Start the day prior to foley removal appointment   traZODone 150 MG tablet Commonly known as:  DESYREL Take 150 mg by mouth at bedtime.       Diagnostic Studies: Dg Chest 1 View  Result Date: 03/18/2016 CLINICAL DATA:  70 year old male  preoperative study. Initial encounter. Personal history of thoracic aortic aneurysm surgical repair. EXAM: CHEST 1 VIEW COMPARISON:  Chest CTA 07/30/2014 and earlier. FINDINGS: PA view of the chest. Stable lung volumes. Mediastinal contours are stable and within normal limits. Sequelae of median sternotomy and CABG. Visualized tracheal air column is within normal limits. Stable surgical clips at the right axilla. Mild pulmonary interstitial markings appear stable. No pneumothorax, pulmonary edema, pleural effusion or acute pulmonary opacity. IMPRESSION: No acute cardiopulmonary abnormality. Electronically Signed   By: Genevie Ann M.D.   On: 03/18/2016 07:54    Disposition: 01-Home or Self Care  Discharge Instructions    Care order/instruction    Complete by:  As directed    Plug CBI port.   Discontinue IV    Complete by:  As directed    Urinary leg bag    Complete by:  As directed       Follow-up Information    Nicolette Bang, MD Follow up on 03/24/2016.   Specialty:  Urology Why:  at 8:45 Contact information: [address] [place] [address] [phone]            Signed: Malka So 03/20/2016, 8:43 AM

## 2016-03-22 NOTE — Op Note (Signed)
PREOPERATIVE DIAGNOSIS: BPH with urinary retention and hematuria  POSTOPERATIVE DIAGNOSIS: Same  PROCEDURES: 1. Robotic-assisted laparoscopic simple prostatectomy.  ANESTHESIA: General  ASSISTANT: Clemetine Marker, PA  RESIDENT: Lorayne Bender, MD  ESTIMATED BLOOD LOSS: 200 mL.  COMPLICATIONS: None.  SPECIMEN: 1.prostatic adenoma  ANTIBIOTICS: Rocephin  FINDINGS: 3cm intravesical prostatic protrusion. Ureteral orifices in normal anatomic location. No leaks from cystotomy at 150cc of water.   DRAINS: 1. Jackson-Pratt drain to bulb suction. 2. Foley catheter to continuous bladder irrigation  INDICATION: Samuel Long is a very pleasant 70 year old gentleman, who has BPH with significant LUTS including gross hematuria. His TRUS volume is 96cc.  Options were discussed with the patient in detail for primary manage including continued surveillance protocols versus surgical extirpation with and without minimally invasive assistance and he wished to proceed with robotic simple prostatectomy. Informed consent was obtained and placed in the medical record.  PROCEDURE IN DETAIL: The patient was brought to the operating room and a breif timeout was down to ensure correct patient, correct procedure, and correct site. Intravenous antibiotics were administered. General endotracheal anesthesia was introduced. The patient was placed into a low lithotomy position after tucking his arms with foam padding, placing on a pink and non-slide foam pad. A test of steep Trendelenburg positioning was performed and he was found to be suitably positioned. Sterile field was created by prepping and draping the patient's penis, perineum and proximal thighs using iodine and his infra-xiphoid abdomen using chlorhexidine gluconate. Next, a high-flow, low-pressure pneumoperitoneum was obtained using Veress technique in the infraumbilical midline having passed the aspiration and drop test. Next, a 8-mm  robotic camera port was placed in the same location. Laparoscopic examination of the peritoneal cavity revealed no significant adhesions and no visceral injury. Additional ports were placed as follows: Right paramedian 8-mm robotic port, right far lateral 12-mm assistant port, left paramedian 8-mm robotic port, left far lateral 8-mm robotic port, and right paramedian 5-mm suction port. Robot was docked and passed through the electronic checks. Next, attention was directed for the development of space of Retzius. Incision was made lateral to the left medial umbilical ligament from the midline towards the area of the internal ring and coursing along the iliac vessels towards the area of the ureter, which was positively identified. The left bladder was dissected away from the pelvic sidewall towards the area of the endopelvic fascia. A mirror-imaged dissection was performed on the right side.  Additional anterior attachments were taken down using cautery scissors. Next, the bladder neck was identified moving the Foley catheter back and forth. We then made a 6cm transverse cystotomy 3cm from the bladder neck. We identified the ureteral orifices and care was taken to exclude then from the dissection. We then placed 3 holding stitches in the anterior bladder wall and secured it to Coopers ligament.  We then made a circumscribing incision around the base of the prostate. We then used a 0 vicryl in a figure of eight fashion in the base of the adenoma for traction. We proceeded with a posterior dissection of the prostate until we identified the capsule. We then used a combination of electrocautery, blunt and sharp dissection to free the adenoma from the capsule. We then dissected laterally to the apex. Individual bleeders were cauterized. We then dissected anteriorly along the capsule until we reached the apex. The adenoma was then freed and placed in an endocatch bag. We noted good hemostasis and no  additional sutures were placed. A Foley catheter was then placed per  urethra easily. We then tacked down the bladder neck to the prostatic fossa with a single interrupted 2-0 vicryl. We then proceeded to closed the cystotomy. We closed the bladder with a running 0 vicryl full thickness. We then performed a imbricating second layer  Closure with 0 vicryl. The bladder was then filled with 150cc of water and we noted no leak.  All sponge and needle counts were correct. A closed suction drain was brought to the previous left lateral robotic port site into the area of the peritoneal cavity. The previous right 12-mm assistant port was closed at the level of the fascia using a Carter-Thomason suture passer under laparoscopic vision. Robot was undocked. Specimen was retrieved by extending the previous camera port site inferiorly for distance approximately 3 cm and removing the prostatectomy specimens and setting aside for permanent pathology. The site was closed at the level of fascia using figure-of-eight 0 vicryl followed by reapproximation of the Scarpa's using running Vicryl. All incision sites were infiltrated with dilute Lyophilized Marcaine and closed at the level of the skin using subcuticular Monocryl followed by Dermabond. Procedure was then terminated. The patient tolerated the procedure well. There were no immediate periprocedural complications and the patient was taken to the postanesthesia care unit in stable Condition. The assistant was utilized for retraction of the posterior bladder wall durign the procedure which was necessary for appropriate visualization of the adenoma.  COMPLICATIONS: None  CONDITION: Stable, extubated, transferred to PACU  PLAN: The patient will be admitted for 1-2 for hydration, post operative monitoring and pain control. He will be discharged home with foley in place and foley will be removed in 14 days, He will have a cystogram prior to foley catheter  removal

## 2016-08-30 ENCOUNTER — Ambulatory Visit (INDEPENDENT_AMBULATORY_CARE_PROVIDER_SITE_OTHER): Payer: Medicare Other | Admitting: Orthopaedic Surgery

## 2016-08-30 ENCOUNTER — Ambulatory Visit (INDEPENDENT_AMBULATORY_CARE_PROVIDER_SITE_OTHER): Payer: Medicare Other

## 2016-08-30 ENCOUNTER — Encounter: Payer: Self-pay | Admitting: Orthopaedic Surgery

## 2016-08-30 VITALS — BP 140/84 | HR 72 | Ht 65.0 in | Wt 166.0 lb

## 2016-08-30 DIAGNOSIS — M458 Ankylosing spondylitis sacral and sacrococcygeal region: Secondary | ICD-10-CM

## 2016-08-30 NOTE — Progress Notes (Signed)
Patient Samuel Long, male DOB:12/15/1945, 71 y.o. BZJ:[id number]  Chief Complaint  Patient presents with  . new problem    Left hip pain    HPI  Samuel Long is a 71 y.o. male who has ankylosing spondylitis. He has had it for years.  He has chronic pain from it.  He goes to a pain clinic.  Recently he has been having pain of the left SI joint area that will not go away.  He has no trauma,no paresthesia, no redness.  He has tried his pain medicine, ice, heat with no help.  I went over his disease with him.  I have recommended web sites to review and study. HPI  Body mass index is 27.62 kg/m.  ROS  Review of Systems  HENT: Negative for congestion.   Respiratory: Negative for cough and shortness of breath.   Cardiovascular: Negative for chest pain and leg swelling.  Endocrine: Positive for cold intolerance.  Musculoskeletal: Positive for arthralgias and joint swelling.  Allergic/Immunologic: Positive for environmental allergies.    Past Medical History:  Diagnosis Date  . Alcohol use   . Anxiety   . Arthritis   . BPH (benign prostatic hyperplasia)   . Cancer ([place])    melanoma - chest and scalp; also has had basal cell   . Cataracts, bilateral   . COPD (chronic obstructive pulmonary disease) ([place])   . Coronary atherosclerosis of native coronary artery    Single-vessel status post SVG to RCA May 2015  . Depression   . Essential hypertension, benign   . History of kidney stones   . HOH (hard of hearing)   . Iritis   . Left rib fracture   . PTSD (post-traumatic stress disorder)   . Spondylitis, ankylosing ([place])   . Thoracic aortic aneurysm [facility])    Status post repair May 2015    Past Surgical History:  Procedure Laterality Date  . BONE CYST EXCISION     Left foot, Hester Joslin  . CORONARY ARTERY BYPASS GRAFT N/A 08/22/2013   Procedure: CORONARY ARTERY BYPASS GRAFTING (CABG) TIMES ONE USING RIGHT THIGH GREATER SAPHENOUS VEIN HARVESTED ENDOSCOPICALLY;  Surgeon: Ivin Poot, MD;  Location: [place];  Service: Open Heart Surgery;  Laterality: N/A;  . INTRAOPERATIVE TRANSESOPHAGEAL ECHOCARDIOGRAM N/A 08/22/2013   Procedure: INTRAOPERATIVE TRANSESOPHAGEAL ECHOCARDIOGRAM;  Surgeon: Ivin Poot, MD;  Location: [place];  Service: Open Heart Surgery;  Laterality: N/A;  . LEFT AND RIGHT HEART CATHETERIZATION WITH CORONARY ANGIOGRAM N/A 07/17/2013   Procedure: LEFT AND RIGHT HEART CATHETERIZATION WITH CORONARY ANGIOGRAM;  Surgeon: Peter M Martinique, MD;  Location: [facility] CATH LAB;  Service: Cardiovascular;  Laterality: N/A;  . REPLACEMENT ASCENDING AORTA N/A 08/22/2013   Procedure: REPLACEMENT ASCENDING AORTA with hemashield graft;  Surgeon: Ivin Poot, MD;  Location: [place];  Service: Open Heart Surgery;  Laterality: N/A;  . TRANSURETHRAL RESECTION OF PROSTATE  06/16/2011   Procedure: TRANSURETHRAL RESECTION OF THE PROSTATE (TURP);  Surgeon: Marissa Nestle, MD;  [facility];  Service: Urology;  Laterality: N/A;  . XI ROBOTIC ASSISTED SIMPLE PROSTATECTOMY N/A 03/18/2016   Procedure: XI ROBOTIC ASSISTED SIMPLE PROSTATECTOMY;  Surgeon: Cleon Gustin, MD;  [facility];  Service: Urology;  Laterality: N/A;    Family History  Problem Relation Age of Onset  . Heart failure Mother   . Heart failure Father   . Heart disease Father   . Depression Father   . Heart disease Paternal Uncle   .  Anesthesia problems Neg Hx   . Hypotension Neg Hx   . Malignant hyperthermia Neg Hx   . Pseudochol deficiency Neg Hx     Social History Social History  Substance Use Topics  . Smoking status: Current Every Day Smoker    Packs/day: 1.00    Years: 40.00    Types: Cigarettes  . Smokeless tobacco: Never Used     Comment: smoking "a few, not as many as it used to be"  . Alcohol use 1.8 oz/week    1 Glasses of wine, 2 Cans of beer per week     Comment: 4-5 beers daily     Allergies  Allergen Reactions  . Indocin [Indomethacin] Other (See Comments)    Migrane     Current Outpatient Prescriptions  Medication Sig Dispense Refill  . amLODipine (NORVASC) 10 MG tablet Take 10 mg by mouth daily.    Marland Kitchen aspirin 81 MG chewable tablet Chew by mouth daily.    . Flaxseed, Linseed, (FLAXSEED OIL PO) Take by mouth.    Marland Kitchen HYDROcodone-acetaminophen (NORCO) 10-325 MG tablet Take 1 tablet by mouth every 4 (four) hours as needed for moderate pain or severe pain. 30 tablet 0  . lisinopril (PRINIVIL,ZESTRIL) 40 MG tablet Take 40 mg by mouth at bedtime.     . Multiple Vitamins-Minerals (CENTRUM SILVER PO) Take by mouth.    . Omega-3 Fatty Acids (FISH OIL PO) Take by mouth.    . simvastatin (ZOCOR) 10 MG tablet Take 10 mg by mouth at bedtime.     . traZODone (DESYREL) 150 MG tablet Take 150 mg by mouth at bedtime.    . sertraline (ZOLOFT) 100 MG tablet Take 100 mg by mouth daily.    Marland Kitchen sulfamethoxazole-trimethoprim (BACTRIM DS,SEPTRA DS) 800-160 MG tablet Take 1 tablet by mouth 2 (two) times daily. Start the day prior to foley removal appointment (Patient not taking: Reported on 08/30/2016) 6 tablet 0   No current facility-administered medications for this visit.      Physical Exam  Blood pressure 140/84, pulse 72, height 5\' 5"  (1.651 m), weight 166 lb (75.3 kg).  Constitutional: overall normal hygiene, normal nutrition, well developed, normal grooming, normal body habitus. Assistive device:none  Musculoskeletal: gait and station Limp left, muscle tone and strength are normal, no tremors or atrophy is present.  .  Neurological: coordination overall normal.  Deep tendon reflex/nerve stretch intact.  Sensation normal.  Cranial nerves II-XII intact.   Skin:   Normal overall no scars, lesions, ulcers or rashes. No psoriasis.  Psychiatric: Alert and oriented x 3.  Recent memory intact, remote memory unclear.  Normal mood and affect. Well groomed.  Good eye contact.  Cardiovascular: overall no swelling, no varicosities, no edema bilaterally, normal temperatures of the  legs and arms, no clubbing, cyanosis and good capillary refill.  Lymphatic: palpation is normal.  He has pain of the left SI joint area.  There is no redness or swelling.  ROM of the left hip is full.  Back ROM is decreased and limited.  NV is intact.  X-rays were made of the left pelvis, reported separately.  The patient has been educated about the nature of the problem(s) and counseled on treatment options.  The patient appeared to understand what I have discussed and is in agreement with it.  Encounter Diagnosis  Name Primary?  Marland Kitchen Ankylosing spondylitis of sacral region [facility]) Yes   Procedure note:  The left SI joint area was prepped after permission from the patient.  1% Xylocaine and 1 cc DepoMedrol was injected into the SI joint area by sterile technique tolerated well.  I told him the injection may or may not help.  He accepts that.  PLAN Call if any problems.  Precautions discussed.  Continue current medications.   Return to clinic 1 month   Electronically Signed Sanjuana Kava, MD 5/15/20182:27 PM

## 2016-08-30 NOTE — Patient Instructions (Signed)
Steps to Quit Smoking Smoking tobacco can be bad for your health. It can also affect almost every organ in your body. Smoking puts you and people around you at risk for many serious Stanely Sexson-lasting (chronic) diseases. Quitting smoking is hard, but it is one of the best things that you can do for your health. It is never too late to quit. What are the benefits of quitting smoking? When you quit smoking, you lower your risk for getting serious diseases and conditions. They can include:  Lung cancer or lung disease.  Heart disease.  Stroke.  Heart attack.  Not being able to have children (infertility).  Weak bones (osteoporosis) and broken bones (fractures). If you have coughing, wheezing, and shortness of breath, those symptoms may get better when you quit. You may also get sick less often. If you are pregnant, quitting smoking can help to lower your chances of having a baby of low birth weight. What can I do to help me quit smoking? Talk with your doctor about what can help you quit smoking. Some things you can do (strategies) include:  Quitting smoking totally, instead of slowly cutting back how much you smoke over a period of time.  Going to in-person counseling. You are more likely to quit if you go to many counseling sessions.  Using resources and support systems, such as:  Online chats with a counselor.  Phone quitlines.  Printed self-help materials.  Support groups or group counseling.  Text messaging programs.  Mobile phone apps or applications.  Taking medicines. Some of these medicines may have nicotine in them. If you are pregnant or breastfeeding, do not take any medicines to quit smoking unless your doctor says it is okay. Talk with your doctor about counseling or other things that can help you. Talk with your doctor about using more than one strategy at the same time, such as taking medicines while you are also going to in-person counseling. This can help make quitting  easier. What things can I do to make it easier to quit? Quitting smoking might feel very hard at first, but there is a lot that you can do to make it easier. Take these steps:  Talk to your family and friends. Ask them to support and encourage you.  Call phone quitlines, reach out to support groups, or work with a counselor.  Ask people who smoke to not smoke around you.  Avoid places that make you want (trigger) to smoke, such as:  Bars.  Parties.  Smoke-break areas at work.  Spend time with people who do not smoke.  Lower the stress in your life. Stress can make you want to smoke. Try these things to help your stress:  Getting regular exercise.  Deep-breathing exercises.  Yoga.  Meditating.  Doing a body scan. To do this, close your eyes, focus on one area of your body at a time from head to toe, and notice which parts of your body are tense. Try to relax the muscles in those areas.  Download or buy apps on your mobile phone or tablet that can help you stick to your quit plan. There are many free apps, such as QuitGuide from the CDC (Centers for Disease Control and Prevention). You can find more support from smokefree.gov and other websites. This information is not intended to replace advice given to you by your health care provider. Make sure you discuss any questions you have with your health care provider. Document Released: 01/29/2009 Document Revised: 12/01/2015 Document   Reviewed: 08/19/2014 Elsevier Interactive Patient Education  2017 Elsevier Inc.  

## 2016-09-27 ENCOUNTER — Encounter: Payer: Self-pay | Admitting: Orthopaedic Surgery

## 2016-09-27 ENCOUNTER — Ambulatory Visit (INDEPENDENT_AMBULATORY_CARE_PROVIDER_SITE_OTHER): Payer: Medicare Other | Admitting: Orthopaedic Surgery

## 2016-09-27 VITALS — BP 133/76 | HR 80 | Temp 97.9°F | Ht 65.0 in | Wt 167.0 lb

## 2016-09-27 DIAGNOSIS — M458 Ankylosing spondylitis sacral and sacrococcygeal region: Secondary | ICD-10-CM | POA: Diagnosis not present

## 2016-09-27 NOTE — Progress Notes (Signed)
Patient Samuel Long, male DOB:27-Apr-1945, 71 y.o. BMW:[id number]  Chief Complaint  Patient presents with  . Follow-up    Left Hip    HPI  Samuel Long is a 71 y.o. male who has ankylosing spondylitis affecting his neck most recently.  He has no new trauma, no paresthesias.  Some days he has more pain than other days.  He is active.  He finds that Voltaren Gel really helps.  I went over with him and his wife things about his disease state and process. HPI  Body mass index is 27.79 kg/m.  ROS  Review of Systems  HENT: Negative for congestion.   Respiratory: Negative for cough and shortness of breath.   Cardiovascular: Negative for chest pain and leg swelling.  Endocrine: Positive for cold intolerance.  Musculoskeletal: Positive for arthralgias and joint swelling.  Allergic/Immunologic: Positive for environmental allergies.    Past Medical History:  Diagnosis Date  . Alcohol use   . Anxiety   . Arthritis   . BPH (benign prostatic hyperplasia)   . Cancer ([place])    melanoma - chest and scalp; also has had basal cell   . Cataracts, bilateral   . COPD (chronic obstructive pulmonary disease) ([place])   . Coronary atherosclerosis of native coronary artery    Single-vessel status post SVG to RCA May 2015  . Depression   . Essential hypertension, benign   . History of kidney stones   . HOH (hard of hearing)   . Iritis   . Left rib fracture   . PTSD (post-traumatic stress disorder)   . Spondylitis, ankylosing ([place])   . Thoracic aortic aneurysm [facility])    Status post repair May 2015    Past Surgical History:  Procedure Laterality Date  . BONE CYST EXCISION     Left foot, Jamie Belger  . CORONARY ARTERY BYPASS GRAFT N/A 08/22/2013   Procedure: CORONARY ARTERY BYPASS GRAFTING (CABG) TIMES ONE USING RIGHT THIGH GREATER SAPHENOUS VEIN HARVESTED ENDOSCOPICALLY;  Surgeon: Ivin Poot, MD;  Location: [place];  Service: Open Heart Surgery;  Laterality: N/A;  . INTRAOPERATIVE TRANSESOPHAGEAL  ECHOCARDIOGRAM N/A 08/22/2013   Procedure: INTRAOPERATIVE TRANSESOPHAGEAL ECHOCARDIOGRAM;  Surgeon: Ivin Poot, MD;  Location: [place];  Service: Open Heart Surgery;  Laterality: N/A;  . LEFT AND RIGHT HEART CATHETERIZATION WITH CORONARY ANGIOGRAM N/A 07/17/2013   Procedure: LEFT AND RIGHT HEART CATHETERIZATION WITH CORONARY ANGIOGRAM;  Surgeon: Peter M Martinique, MD;  Location: [facility] CATH LAB;  Service: Cardiovascular;  Laterality: N/A;  . REPLACEMENT ASCENDING AORTA N/A 08/22/2013   Procedure: REPLACEMENT ASCENDING AORTA with hemashield graft;  Surgeon: Ivin Poot, MD;  Location: [place];  Service: Open Heart Surgery;  Laterality: N/A;  . TRANSURETHRAL RESECTION OF PROSTATE  06/16/2011   Procedure: TRANSURETHRAL RESECTION OF THE PROSTATE (TURP);  Surgeon: Marissa Nestle, MD;  [facility];  Service: Urology;  Laterality: N/A;  . XI ROBOTIC ASSISTED SIMPLE PROSTATECTOMY N/A 03/18/2016   Procedure: XI ROBOTIC ASSISTED SIMPLE PROSTATECTOMY;  Surgeon: Cleon Gustin, MD;  [facility];  Service: Urology;  Laterality: N/A;    Family History  Problem Relation Age of Onset  . Heart failure Mother   . Heart failure Father   . Heart disease Father   . Depression Father   . Heart disease Paternal Uncle   . Anesthesia problems Neg Hx   . Hypotension Neg Hx   . Malignant hyperthermia Neg Hx   . Pseudochol deficiency Neg Hx  Social History Social History  Substance Use Topics  . Smoking status: Current Every Day Smoker    Packs/day: 1.00    Years: 40.00    Types: Cigarettes  . Smokeless tobacco: Never Used     Comment: smoking "a few, not as many as it used to be"  . Alcohol use 1.8 oz/week    1 Glasses of wine, 2 Cans of beer per week     Comment: 4-5 beers daily     Allergies  Allergen Reactions  . Indocin [Indomethacin] Other (See Comments)    Migrane    Current Outpatient Prescriptions  Medication Sig Dispense Refill  . amLODipine (NORVASC) 10 MG tablet Take 10 mg by  mouth daily.    Marland Kitchen aspirin 81 MG chewable tablet Chew by mouth daily.    . Flaxseed, Linseed, (FLAXSEED OIL PO) Take by mouth.    Marland Kitchen HYDROcodone-acetaminophen (NORCO) 10-325 MG tablet Take 1 tablet by mouth every 4 (four) hours as needed for moderate pain or severe pain. 30 tablet 0  . lisinopril (PRINIVIL,ZESTRIL) 40 MG tablet Take 40 mg by mouth at bedtime.     . Multiple Vitamins-Minerals (CENTRUM SILVER PO) Take by mouth.    . Omega-3 Fatty Acids (FISH OIL PO) Take by mouth.    . sertraline (ZOLOFT) 100 MG tablet Take 100 mg by mouth daily.    . simvastatin (ZOCOR) 10 MG tablet Take 10 mg by mouth at bedtime.     . sulfamethoxazole-trimethoprim (BACTRIM DS,SEPTRA DS) 800-160 MG tablet Take 1 tablet by mouth 2 (two) times daily. Start the day prior to foley removal appointment (Patient not taking: Reported on 08/30/2016) 6 tablet 0  . traZODone (DESYREL) 150 MG tablet Take 150 mg by mouth at bedtime.     No current facility-administered medications for this visit.      Physical Exam  Blood pressure 133/76, pulse 80, temperature 97.9 F (36.6 C), height 5\' 5"  (1.651 m), weight 167 lb (75.8 kg).  Constitutional: overall normal hygiene, normal nutrition, well developed, normal grooming, normal body habitus. Assistive device:none  Musculoskeletal: gait and station Limp none, muscle tone and strength are normal, no tremors or atrophy is present.  .  Neurological: coordination overall normal.  Deep tendon reflex/nerve stretch intact.  Sensation normal.  Cranial nerves II-XII intact.   Skin:   Normal overall no scars, lesions, ulcers or rashes. No psoriasis.  Psychiatric: Alert and oriented x 3.  Recent memory intact, remote memory unclear.  Normal mood and affect. Well groomed.  Good eye contact.  Cardiovascular: overall no swelling, no varicosities, no edema bilaterally, normal temperatures of the legs and arms, no clubbing, cyanosis and good capillary refill.  Lymphatic: palpation is  normal.  His neck is tender and he has decreased motion with 5 to the left and 10 to the right and extension is limited to 10, forward flexion is limited to 10 and painful.  NV intact.  Grips normal.    The patient has been educated about the nature of the problem(s) and counseled on treatment options.  The patient appeared to understand what I have discussed and is in agreement with it.  Encounter Diagnosis  Name Primary?  Marland Kitchen Ankylosing spondylitis of sacral region [facility]) Yes    PLAN Call if any problems.  Precautions discussed.  Continue current medications.   Return to clinic as needed.   Electronically Signed Sanjuana Kava, MD 6/12/20182:04 PM

## 2016-09-27 NOTE — Patient Instructions (Signed)
Steps to Quit Smoking Smoking tobacco can be bad for your health. It can also affect almost every organ in your body. Smoking puts you and people around you at risk for many serious Samuel Long-lasting (chronic) diseases. Quitting smoking is hard, but it is one of the best things that you can do for your health. It is never too late to quit. What are the benefits of quitting smoking? When you quit smoking, you lower your risk for getting serious diseases and conditions. They can include:  Lung cancer or lung disease.  Heart disease.  Stroke.  Heart attack.  Not being able to have children (infertility).  Weak bones (osteoporosis) and broken bones (fractures).  If you have coughing, wheezing, and shortness of breath, those symptoms may get better when you quit. You may also get sick less often. If you are pregnant, quitting smoking can help to lower your chances of having a baby of low birth weight. What can I do to help me quit smoking? Talk with your doctor about what can help you quit smoking. Some things you can do (strategies) include:  Quitting smoking totally, instead of slowly cutting back how much you smoke over a period of time.  Going to in-person counseling. You are more likely to quit if you go to many counseling sessions.  Using resources and support systems, such as: ? Online chats with a counselor. ? Phone quitlines. ? Printed self-help materials. ? Support groups or group counseling. ? Text messaging programs. ? Mobile phone apps or applications.  Taking medicines. Some of these medicines may have nicotine in them. If you are pregnant or breastfeeding, do not take any medicines to quit smoking unless your doctor says it is okay. Talk with your doctor about counseling or other things that can help you.  Talk with your doctor about using more than one strategy at the same time, such as taking medicines while you are also going to in-person counseling. This can help make  quitting easier. What things can I do to make it easier to quit? Quitting smoking might feel very hard at first, but there is a lot that you can do to make it easier. Take these steps:  Talk to your family and friends. Ask them to support and encourage you.  Call phone quitlines, reach out to support groups, or work with a counselor.  Ask people who smoke to not smoke around you.  Avoid places that make you want (trigger) to smoke, such as: ? Bars. ? Parties. ? Smoke-break areas at work.  Spend time with people who do not smoke.  Lower the stress in your life. Stress can make you want to smoke. Try these things to help your stress: ? Getting regular exercise. ? Deep-breathing exercises. ? Yoga. ? Meditating. ? Doing a body scan. To do this, close your eyes, focus on one area of your body at a time from head to toe, and notice which parts of your body are tense. Try to relax the muscles in those areas.  Download or buy apps on your mobile phone or tablet that can help you stick to your quit plan. There are many free apps, such as QuitGuide from the CDC (Centers for Disease Control and Prevention). You can find more support from smokefree.gov and other websites.  This information is not intended to replace advice given to you by your health care provider. Make sure you discuss any questions you have with your health care provider. Document Released: 01/29/2009 Document   Revised: 12/01/2015 Document Reviewed: 08/19/2014 Elsevier Interactive Patient Education  2018 Elsevier Inc.  

## 2018-01-03 ENCOUNTER — Ambulatory Visit: Payer: Medicare Other | Admitting: Orthopaedic Surgery

## 2018-01-03 ENCOUNTER — Encounter: Payer: Self-pay | Admitting: Orthopaedic Surgery

## 2018-01-03 VITALS — BP 137/86 | HR 93 | Ht 69.0 in | Wt 165.0 lb

## 2018-01-03 DIAGNOSIS — M458 Ankylosing spondylitis sacral and sacrococcygeal region: Secondary | ICD-10-CM

## 2018-01-03 MED ORDER — NAPROXEN 500 MG PO TABS: 500.0000 mg | ORAL_TABLET | Freq: Two times a day (BID) | ORAL | 5 refills | Status: AC

## 2018-01-03 NOTE — Progress Notes (Signed)
Patient Samuel Long, male DOB:21-Jan-1946, 72 y.o. [id number]  Chief Complaint  Patient presents with  . Hip Pain    left with multiple sites of pain.  Back, neck, shoulders    HPI  Samuel Long is a 72 y.o. male who has ankylosing spondylitis that is getting worse and he is having more and more pain and decreased activity.  He is seeing a pain management doctor for the pain.  He is concerned that his activity is decreasing and he is having pain at rest.  I saw him last about a year and a half ago.  I will begin Naprosyn.  I will have him seen at [facility] to see if they have any new or better suggestions for management.  He is agreeable to this.   Body mass index is 24.37 kg/m.  ROS  Review of Systems  HENT: Negative for congestion.   Respiratory: Negative for cough and shortness of breath.   Cardiovascular: Negative for chest pain and leg swelling.  Endocrine: Positive for cold intolerance.  Musculoskeletal: Positive for arthralgias and joint swelling.  Allergic/Immunologic: Positive for environmental allergies.    All other systems reviewed and are negative.  The following is a summary of the past history medically, past history surgically, known current medicines, social history and family history.  This information is gathered electronically by the computer from prior information and documentation.  I review this each visit and have found including this information at this point in the chart is beneficial and informative.    Past Medical History:  Diagnosis Date  . Alcohol use   . Anxiety   . Arthritis   . BPH (benign prostatic hyperplasia)   . Cancer ([place])    melanoma - chest and scalp; also has had basal cell   . Cataracts, bilateral   . COPD (chronic obstructive pulmonary disease) ([place])   . Coronary atherosclerosis of native coronary artery    Single-vessel status post SVG to RCA May 2015  . Depression   . Essential hypertension, benign   .  History of kidney stones   . HOH (hard of hearing)   . Iritis   . Left rib fracture   . PTSD (post-traumatic stress disorder)   . Spondylitis, ankylosing ([place])   . Thoracic aortic aneurysm [facility])    Status post repair May 2015    Past Surgical History:  Procedure Laterality Date  . BONE CYST EXCISION     Left foot, Lassie Demorest  . CORONARY ARTERY BYPASS GRAFT N/A 08/22/2013   Procedure: CORONARY ARTERY BYPASS GRAFTING (CABG) TIMES ONE USING RIGHT THIGH GREATER SAPHENOUS VEIN HARVESTED ENDOSCOPICALLY;  Surgeon: Ivin Poot, MD;  Location: [place];  Service: Open Heart Surgery;  Laterality: N/A;  . INTRAOPERATIVE TRANSESOPHAGEAL ECHOCARDIOGRAM N/A 08/22/2013   Procedure: INTRAOPERATIVE TRANSESOPHAGEAL ECHOCARDIOGRAM;  Surgeon: Ivin Poot, MD;  Location: [place];  Service: Open Heart Surgery;  Laterality: N/A;  . LEFT AND RIGHT HEART CATHETERIZATION WITH CORONARY ANGIOGRAM N/A 07/17/2013   Procedure: LEFT AND RIGHT HEART CATHETERIZATION WITH CORONARY ANGIOGRAM;  Surgeon: Peter M Martinique, MD;  Location: [facility] CATH LAB;  Service: Cardiovascular;  Laterality: N/A;  . REPLACEMENT ASCENDING AORTA N/A 08/22/2013   Procedure: REPLACEMENT ASCENDING AORTA with hemashield graft;  Surgeon: Ivin Poot, MD;  Location: [place];  Service: Open Heart Surgery;  Laterality: N/A;  . TRANSURETHRAL RESECTION OF PROSTATE  06/16/2011   Procedure: TRANSURETHRAL RESECTION OF THE PROSTATE (TURP);  Surgeon: Marissa Nestle, MD;  Location: [facility];  Service: Urology;  Laterality: N/A;  . XI ROBOTIC ASSISTED SIMPLE PROSTATECTOMY N/A 03/18/2016   Procedure: XI ROBOTIC ASSISTED SIMPLE PROSTATECTOMY;  Surgeon: Cleon Gustin, MD;  Location: [facility];  Service: Urology;  Laterality: N/A;    Family History  Problem Relation Age of Onset  . Heart failure Mother   . Heart failure Father   . Heart disease Father   . Depression Father   . Heart disease Paternal Uncle   . Anesthesia problems Neg Hx   . Hypotension Neg Hx   .  Malignant hyperthermia Neg Hx   . Pseudochol deficiency Neg Hx     Social History Social History   Tobacco Use  . Smoking status: Current Every Day Smoker    Packs/day: 1.00    Years: 40.00    Pack years: 40.00    Types: Cigarettes  . Smokeless tobacco: Never Used  . Tobacco comment: smoking "a few, not as many as it used to be"  Substance Use Topics  . Alcohol use: Yes    Alcohol/week: 3.0 standard drinks    Types: 1 Glasses of wine, 2 Cans of beer per week    Comment: 4-5 beers daily   . Drug use: No    Allergies  Allergen Reactions  . Indocin [Indomethacin] Other (See Comments)    Migrane    Current Outpatient Medications  Medication Sig Dispense Refill  . amLODipine (NORVASC) 10 MG tablet Take 10 mg by mouth daily.    Marland Kitchen aspirin 81 MG chewable tablet Chew by mouth daily.    . Flaxseed, Linseed, (FLAXSEED OIL PO) Take by mouth.    Marland Kitchen HYDROcodone-acetaminophen (NORCO) 10-325 MG tablet Take 1 tablet by mouth every 4 (four) hours as needed for moderate pain or severe pain. 30 tablet 0  . lisinopril (PRINIVIL,ZESTRIL) 40 MG tablet Take 40 mg by mouth at bedtime.     . Multiple Vitamins-Minerals (CENTRUM SILVER PO) Take by mouth.    . Omega-3 Fatty Acids (FISH OIL PO) Take by mouth.    . sertraline (ZOLOFT) 100 MG tablet Take 100 mg by mouth daily.    . simvastatin (ZOCOR) 10 MG tablet Take 10 mg by mouth at bedtime.     . sulfamethoxazole-trimethoprim (BACTRIM DS,SEPTRA DS) 800-160 MG tablet Take 1 tablet by mouth 2 (two) times daily. Start the day prior to foley removal appointment (Patient not taking: Reported on 08/30/2016) 6 tablet 0  . traZODone (DESYREL) 150 MG tablet Take 150 mg by mouth at bedtime.     No current facility-administered medications for this visit.      Physical Exam  Blood pressure 137/86, pulse 93, height 5\' 9"  (1.753 m), weight 165 lb (74.8 kg).  Constitutional: overall normal hygiene, normal nutrition, well developed, normal grooming, normal  body habitus. Assistive device:none  Musculoskeletal: gait and station Limp none, muscle tone and strength are normal, no tremors or atrophy is present.  .  Neurological: coordination overall normal.  Deep tendon reflex/nerve stretch intact.  Sensation normal.  Cranial nerves II-XII intact.   Skin:   Normal overall no scars, lesions, ulcers or rashes. No psoriasis.  Psychiatric: Alert and oriented x 3.  Recent memory intact, remote memory unclear.  Normal mood and affect. Well groomed.  Good eye contact.  Cardiovascular: overall no swelling, no varicosities, no edema bilaterally, normal temperatures of the legs and arms, no clubbing, cyanosis and good capillary refill.  Lymphatic: palpation is normal.  He has decreased motion of  the neck. He has a tender thoracic and lumbar spine with pain of the left hip and lower back on the left side.  He walks slowly.  He is in pain.  All other systems reviewed and are negative   The patient has been educated about the nature of the problem(s) and counseled on treatment options.  The patient appeared to understand what I have discussed and is in agreement with it.  Encounter Diagnosis  Name Primary?  Marland Kitchen Ankylosing spondylitis of sacral region [facility]) Yes    PLAN Call if any problems.  Precautions discussed.  Continue current medications. Begin Naprosyn.  Return to clinic 1 month   Set up appointment to [facility].  Electronically Dougherty, MD 9/18/20192:03 PM

## 2018-01-03 NOTE — Patient Instructions (Signed)

## 2018-01-31 ENCOUNTER — Ambulatory Visit: Payer: Medicare Other | Admitting: Orthopaedic Surgery

## 2018-01-31 ENCOUNTER — Encounter: Payer: Self-pay | Admitting: Orthopaedic Surgery

## 2018-01-31 VITALS — BP 132/80 | HR 97 | Ht 69.0 in | Wt 164.0 lb

## 2018-01-31 DIAGNOSIS — M458 Ankylosing spondylitis sacral and sacrococcygeal region: Secondary | ICD-10-CM

## 2018-01-31 NOTE — Progress Notes (Signed)
CC:  I have a lot of questions.  He and his wife were seen to discuss his recent visit to [facility] about his ankylosing spondylitis.  I have not received his notes yet.  They asked pertinent questions about what he has learned.  They are to go to [facility] Friday for possible nerve ablation procedure.  He is very reluctant to do so.   I spent 25 to 28 minutes with them explained about his disease and the possible procedure.  Since I do not have his most recent notes, I do not know specifically what is planned.  I did answer their questions and concerns.  I feel that after our talk, they felt more informed.  He wanted a "plain [place]" translation of his problem and possible treatment options. I think I gave them this.  I will see him as needed.  If they have further questions, call me.  Electronically Signed Sanjuana Kava, MD 10/16/20193:37 PM

## 2018-02-16 DEATH — deceased
